# Patient Record
Sex: Female | Born: 1965 | Race: White | Hispanic: No | Marital: Married | Smoking: Never smoker
Health system: Southern US, Community
[De-identification: ages and names within clinical notes are randomized; demographics above are authoritative.]

## PROBLEM LIST (undated history)

## (undated) DIAGNOSIS — L719 Rosacea, unspecified: Secondary | ICD-10-CM

## (undated) DIAGNOSIS — F341 Dysthymic disorder: Secondary | ICD-10-CM

## (undated) DIAGNOSIS — J309 Allergic rhinitis, unspecified: Secondary | ICD-10-CM

## (undated) DIAGNOSIS — E039 Hypothyroidism, unspecified: Secondary | ICD-10-CM

## (undated) HISTORY — DX: Allergic rhinitis, unspecified: J30.9

## (undated) HISTORY — PX: WISDOM TOOTH EXTRACTION: SHX21

## (undated) HISTORY — DX: Rosacea, unspecified: L71.9

## (undated) HISTORY — DX: Dysthymic disorder: F34.1

## (undated) HISTORY — DX: Hypothyroidism, unspecified: E03.9

---

## 1999-01-10 ENCOUNTER — Other Ambulatory Visit: Admission: RE | Admit: 1999-01-10 | Discharge: 1999-01-10 | Payer: Self-pay | Admitting: Obstetrics & Gynecology

## 2000-05-22 ENCOUNTER — Other Ambulatory Visit: Admission: RE | Admit: 2000-05-22 | Discharge: 2000-05-22 | Payer: Self-pay | Admitting: Obstetrics & Gynecology

## 2001-07-04 ENCOUNTER — Other Ambulatory Visit: Admission: RE | Admit: 2001-07-04 | Discharge: 2001-07-04 | Payer: Self-pay | Admitting: Obstetrics & Gynecology

## 2001-12-23 ENCOUNTER — Other Ambulatory Visit: Admission: RE | Admit: 2001-12-23 | Discharge: 2001-12-23 | Payer: Self-pay | Admitting: Obstetrics & Gynecology

## 2002-07-31 ENCOUNTER — Other Ambulatory Visit: Admission: RE | Admit: 2002-07-31 | Discharge: 2002-07-31 | Payer: Self-pay | Admitting: Obstetrics & Gynecology

## 2003-07-15 ENCOUNTER — Other Ambulatory Visit: Admission: RE | Admit: 2003-07-15 | Discharge: 2003-07-15 | Payer: Self-pay | Admitting: Obstetrics & Gynecology

## 2004-07-26 ENCOUNTER — Other Ambulatory Visit: Admission: RE | Admit: 2004-07-26 | Discharge: 2004-07-26 | Payer: Self-pay | Admitting: Obstetrics & Gynecology

## 2009-11-25 ENCOUNTER — Ambulatory Visit: Payer: Self-pay | Admitting: Internal Medicine

## 2009-11-25 DIAGNOSIS — J309 Allergic rhinitis, unspecified: Secondary | ICD-10-CM

## 2009-11-25 DIAGNOSIS — J302 Other seasonal allergic rhinitis: Secondary | ICD-10-CM | POA: Insufficient documentation

## 2009-11-25 DIAGNOSIS — F330 Major depressive disorder, recurrent, mild: Secondary | ICD-10-CM | POA: Insufficient documentation

## 2009-11-25 DIAGNOSIS — E890 Postprocedural hypothyroidism: Secondary | ICD-10-CM

## 2009-11-25 DIAGNOSIS — F329 Major depressive disorder, single episode, unspecified: Secondary | ICD-10-CM

## 2010-03-04 ENCOUNTER — Encounter: Payer: Self-pay | Admitting: Internal Medicine

## 2010-03-14 NOTE — Assessment & Plan Note (Signed)
Summary: to be est/njr   Vital Signs:  Patient profile:   45 year old female Height:      69 inches Weight:      167 pounds BMI:     24.75 Temp:     97.8 degrees F oral BP sitting:   110 / 70  (right arm) Cuff size:   regular  Vitals Entered By: Duard Brady LPN (November 25, 2009 2:51 PM) CC: new to establish - needs PCP Is Patient Diabetic? No   CC:  new to establish - needs PCP.  History of Present Illness: 45 year old patient who is seen to establish with our practice.  She is status post Graves' disease at age 56 treated for a number of years with PTU and following I-131 at age 23.  She has treated hypothyroidism and a long history depression.  She has been on sertraline for least 12 years and is followed annually by Dr. Betti Cruz  Preventive Screening-Counseling & Management  Caffeine-Diet-Exercise     Does Patient Exercise: yes  Allergies (verified): No Known Drug Allergies  Past History:  Past Medical History: Depression (ReddY) rosacea Danella Deis) Hypothyroidism status post I-131 for Graves' disease at age 38 Allergic rhinitis  Past Surgical History: none   Family History: Reviewed history and no changes required. father age 66 in good health with hypercholesterolemia mother, age 66, bipolar disorder one brother, undefined psychiatric condition unable to live independently  Social History: Reviewed history and no changes required. 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    Physical Exam  General:  Well-developed,well-nourished,in no acute distress; alert,appropriate and cooperative throughout examination Head:  Normocephalic and atraumatic without obvious abnormalities. No apparent alopecia or balding. Eyes:  No corneal or conjunctival inflammation noted. EOMI. Perrla. Funduscopic exam benign, without hemorrhages, exudates or papilledema. Vision grossly normal. Ears:  External ear exam shows no significant lesions or deformities.  Otoscopic examination reveals clear canals, tympanic membranes are intact bilaterally without bulging, retraction, inflammation or discharge. Hearing is grossly normal bilaterally. Mouth:  Oral mucosa and oropharynx without lesions or exudates.  Teeth in good repair. Neck:  No deformities, masses, or tenderness noted. Chest Wall:  No deformities, masses, or tenderness noted. Lungs:  Normal respiratory effort, chest expands symmetrically. Lungs are clear to auscultation, no crackles or wheezes. Heart:  Normal rate and regular rhythm. S1 and S2 normal without gallop, murmur, click, rub or other extra sounds. Abdomen:  Bowel sounds positive,abdomen soft and non-tender without masses, organomegaly or hernias noted. Msk:  No deformity or scoliosis noted of thoracic or lumbar spine.   Pulses:  R and L carotid,radial,femoral,dorsalis pedis and posterior tibial pulses are full and equal bilaterally Extremities:  No clubbing, cyanosis, edema, or deformity noted with normal full range of motion of all joints.   Skin:  Intact without suspicious lesions or rashes Cervical Nodes:  No lymphadenopathy noted Axillary Nodes:  No palpable lymphadenopathy Inguinal Nodes:  No significant adenopathy   Impression & Recommendations:  Problem # 1:  Preventive Health Care  (  ICD-V70.0)  Complete Medication List: 1)  Levothroid 175 Mcg Tabs (Levothyroxine sodium) .... Qd 2)  Sertraline Hcl 100 Mg Tabs (Sertraline hcl) .... Qd 3)  Metronidazole 0.75 % Gel (Metronidazole) .... Qd 4)  Multivitamins Tabs (Multiple vitamin) .... Qd  Patient Instructions: 1)  Please schedule a follow-up appointment as needed. 2)  Limit your Sodium (Salt). 3)  It is important that you exercise regularly at least 20 minutes 5 times a week. If you develop chest pain, have severe difficulty breathing, or feel very tired , stop exercising immediately and seek medical attention. 4)  annual gynecologic follow-up Prescriptions: MULTIVITAMINS  TABS (MULTIPLE VITAMIN) qd  #0 x 0   Entered and Authorized by:   Gordy Savers  MD   Signed by:   Gordy Savers  MD on 11/25/2009   Method used:   Print then Give to Patient   RxID:   [id number]    Immunization History:  Influenza Immunization History:    Influenza:  Historical (10/27/2009) per pt done at work. KIK

## 2010-06-15 ENCOUNTER — Encounter: Payer: Self-pay | Admitting: Internal Medicine

## 2010-06-15 ENCOUNTER — Ambulatory Visit (INDEPENDENT_AMBULATORY_CARE_PROVIDER_SITE_OTHER): Payer: BC Managed Care – PPO | Admitting: Internal Medicine

## 2010-06-15 DIAGNOSIS — E039 Hypothyroidism, unspecified: Secondary | ICD-10-CM

## 2010-06-15 NOTE — Patient Instructions (Signed)
Get plenty of rest, Drink lots of  clear liquids, and use Tylenol or ibuprofen for fever and discomfort.    Call or return to clinic prn if these symptoms worsen or fail to improve as anticipated.  

## 2010-06-15 NOTE — Progress Notes (Signed)
  Subjective:    Patient ID: Sara Hull, female    DOB: 03-10-1965, 45 y.o.   MRN: [id number]  HPI  45 year old patient who presents with a one-week history of head and chest congestion she has had the mildly productive cough yielding mainly clear sputum but occasionally yellow tinged. There's been no fever she has had intermittent hoarseness nasal congestion rhinorrhea and a general sense of unwellness. She does have a history of allergic rhinitis;  she took a sick day 3 days ago.    Review of Systems  Constitutional: Positive for fatigue.  HENT: Positive for congestion, rhinorrhea and postnasal drip. Negative for hearing loss, sore throat, dental problem, sinus pressure and tinnitus.   Eyes: Negative for pain, discharge and visual disturbance.  Respiratory: Positive for cough. Negative for shortness of breath.   Cardiovascular: Negative for chest pain, palpitations and leg swelling.  Gastrointestinal: Negative for nausea, vomiting, abdominal pain, diarrhea, constipation, blood in stool and abdominal distention.  Genitourinary: Negative for dysuria, urgency, frequency, hematuria, flank pain, vaginal bleeding, vaginal discharge, difficulty urinating, vaginal pain and pelvic pain.  Musculoskeletal: Negative for joint swelling, arthralgias and gait problem.  Skin: Negative for rash.  Neurological: Negative for dizziness, syncope, speech difficulty, weakness, numbness and headaches.  Hematological: Negative for adenopathy.  Psychiatric/Behavioral: Negative for behavioral problems, dysphoric mood and agitation. The patient is not nervous/anxious.        Objective:   Physical Exam  Constitutional: She is oriented to person, place, and time. She appears well-developed and well-nourished.  HENT:  Head: Normocephalic.  Right Ear: External ear normal.  Left Ear: External ear normal.  Mouth/Throat: Oropharynx is clear and moist.  Eyes: Conjunctivae and EOM are normal. Pupils are equal,  round, and reactive to light.  Neck: Normal range of motion. Neck supple. No thyromegaly present.  Cardiovascular: Normal rate, regular rhythm, normal heart sounds and intact distal pulses.   Pulmonary/Chest: Effort normal and breath sounds normal.  Abdominal: Soft. Bowel sounds are normal. She exhibits no mass. There is no tenderness.  Musculoskeletal: Normal range of motion.  Lymphadenopathy:    She has no cervical adenopathy.  Neurological: She is alert and oriented to person, place, and time.  Skin: Skin is warm and dry. No rash noted.  Psychiatric: She has a normal mood and affect. Her behavior is normal.          Assessment & Plan:   Viral URI. Will treat symptomatically. Was given samples of Vimovo to take one twice daily

## 2010-06-23 ENCOUNTER — Encounter: Payer: Self-pay | Admitting: Internal Medicine

## 2010-06-23 ENCOUNTER — Ambulatory Visit (INDEPENDENT_AMBULATORY_CARE_PROVIDER_SITE_OTHER): Payer: BC Managed Care – PPO | Admitting: Internal Medicine

## 2010-06-23 VITALS — BP 110/70 | Temp 98.1°F | Wt 172.0 lb

## 2010-06-23 DIAGNOSIS — J309 Allergic rhinitis, unspecified: Secondary | ICD-10-CM

## 2010-06-23 MED ORDER — METHYLPREDNISOLONE ACETATE 80 MG/ML IJ SUSP
120.0000 mg | Freq: Once | INTRAMUSCULAR | Status: AC
  Administered 2010-06-23: 120 mg via INTRAMUSCULAR

## 2010-06-23 MED ORDER — PREDNISONE 10 MG PO TABS: 10.0000 mg | ORAL_TABLET | Freq: Two times a day (BID) | ORAL | Status: AC

## 2010-06-23 NOTE — Progress Notes (Signed)
  Subjective:    Patient ID: Sara Hull, female    DOB: 27-Aug-1965, 45 y.o.   MRN: [id number]  HPI  45 year old patient who is seen today with a 2 week history of extreme sinus congestion she has had some minimal sore throat and cough. The cough has improved her chief complaint is sinus congestion left ear discomfort. She does have a history of allergic rhinitis. She has been on Mucinex being and Nasonex with minimal relief. There's been no fever or purulent sputum production. No fever or sinus pain    Review of Systems  Constitutional: Positive for fatigue.  HENT: Positive for congestion. Negative for hearing loss, sore throat, rhinorrhea, dental problem, sinus pressure and tinnitus.   Eyes: Negative for pain, discharge and visual disturbance.  Respiratory: Positive for cough. Negative for shortness of breath.   Cardiovascular: Negative for chest pain, palpitations and leg swelling.  Gastrointestinal: Negative for nausea, vomiting, abdominal pain, diarrhea, constipation, blood in stool and abdominal distention.  Genitourinary: Negative for dysuria, urgency, frequency, hematuria, flank pain, vaginal bleeding, vaginal discharge, difficulty urinating, vaginal pain and pelvic pain.  Musculoskeletal: Negative for joint swelling, arthralgias and gait problem.  Skin: Negative for rash.  Neurological: Negative for dizziness, syncope, speech difficulty, weakness, numbness and headaches.  Hematological: Negative for adenopathy.  Psychiatric/Behavioral: Negative for behavioral problems, dysphoric mood and agitation. The patient is not nervous/anxious.        Objective:   Physical Exam  Constitutional: She is oriented to person, place, and time. She appears well-developed and well-nourished.  HENT:  Head: Normocephalic.  Right Ear: External ear normal.  Left Ear: External ear normal.  Mouth/Throat: Oropharynx is clear and moist.  Eyes: Conjunctivae and EOM are normal. Pupils are equal, round,  and reactive to light.  Neck: Normal range of motion. Neck supple. No thyromegaly present.  Cardiovascular: Normal rate, regular rhythm, normal heart sounds and intact distal pulses.   Pulmonary/Chest: Effort normal and breath sounds normal.  Abdominal: Soft. Bowel sounds are normal. She exhibits no mass. There is no tenderness.  Musculoskeletal: Normal range of motion.  Lymphadenopathy:    She has no cervical adenopathy.  Neurological: She is alert and oriented to person, place, and time.  Skin: Skin is warm and dry. No rash noted.  Psychiatric: She has a normal mood and affect. Her behavior is normal.          Assessment & Plan:   URI with allergic rhinitis flare  We'll continue Mucinex being and Nasonex. Will treat with oral prednisone for 7 days

## 2010-06-23 NOTE — Patient Instructions (Signed)
Continue present regimen Prednisone 10 mg daily for 7 days  Call or return to clinic prn if these symptoms worsen or fail to improve as anticipated.

## 2010-12-15 ENCOUNTER — Ambulatory Visit: Payer: BC Managed Care – PPO | Admitting: Family Medicine

## 2012-03-06 ENCOUNTER — Other Ambulatory Visit (INDEPENDENT_AMBULATORY_CARE_PROVIDER_SITE_OTHER): Payer: BC Managed Care – PPO

## 2012-03-06 DIAGNOSIS — Z0000 Encounter for general adult medical examination without abnormal findings: Secondary | ICD-10-CM

## 2012-03-06 LAB — BASIC METABOLIC PANEL
CO2: 29 mEq/L (ref 19–32)
Calcium: 8.8 mg/dL (ref 8.4–10.5)
Chloride: 104 mEq/L (ref 96–112)
Creatinine, Ser: 0.9 mg/dL (ref 0.4–1.2)
GFR: 74.94 mL/min (ref >60.00)
Glucose, Bld: 89 mg/dL (ref 70–99)
Sodium: 138 mEq/L (ref 135–145)

## 2012-03-06 LAB — CBC WITH DIFFERENTIAL/PLATELET
Basophils Absolute: 0 10*3/uL (ref 0.0–0.1)
Basophils Relative: 0.7 % (ref 0.0–3.0)
Eosinophils Absolute: 0.1 10*3/uL (ref 0.0–0.7)
HCT: 42.8 % (ref 36.0–46.0)
Hemoglobin: 14.6 g/dL (ref 12.0–15.0)
Lymphocytes Relative: 26.8 % (ref 12.0–46.0)
Lymphs Abs: 1.7 10*3/uL (ref 0.7–4.0)
MCHC: 34.1 g/dL (ref 30.0–36.0)
MCV: 91.9 fl (ref 78.0–100.0)
Monocytes Absolute: 0.5 10*3/uL (ref 0.1–1.0)
Monocytes Relative: 7.5 % (ref 3.0–12.0)
Neutro Abs: 4 10*3/uL (ref 1.4–7.7)
Neutrophils Relative %: 63.4 % (ref 43.0–77.0)
Platelets: 183 10*3/uL (ref 150.0–400.0)
RBC: 4.66 Mil/uL (ref 3.87–5.11)
RDW: 13.3 % (ref 11.5–14.6)
WBC: 6.4 10*3/uL (ref 4.5–10.5)

## 2012-03-06 LAB — POCT URINALYSIS DIPSTICK
Glucose, UA: NEGATIVE
Leukocytes, UA: NEGATIVE
Protein, UA: NEGATIVE
Spec Grav, UA: 1.02
Urobilinogen, UA: 0.2

## 2012-03-06 LAB — LIPID PANEL
Cholesterol: 160 mg/dL (ref 0–200)
Triglycerides: 99 mg/dL (ref 0.0–149.0)

## 2012-03-06 LAB — HEPATIC FUNCTION PANEL
Albumin: 3.7 g/dL (ref 3.5–5.2)
Alkaline Phosphatase: 66 U/L (ref 39–117)
Bilirubin, Direct: 0.1 mg/dL (ref 0.0–0.3)
Total Protein: 6.7 g/dL (ref 6.0–8.3)

## 2012-04-01 ENCOUNTER — Encounter: Payer: Self-pay | Admitting: Internal Medicine

## 2012-04-01 ENCOUNTER — Ambulatory Visit (INDEPENDENT_AMBULATORY_CARE_PROVIDER_SITE_OTHER): Payer: BC Managed Care – PPO | Admitting: Internal Medicine

## 2012-04-01 VITALS — BP 140/84 | HR 90 | Temp 97.9°F | Resp 18 | Ht 69.5 in | Wt 187.0 lb

## 2012-04-01 DIAGNOSIS — F329 Major depressive disorder, single episode, unspecified: Secondary | ICD-10-CM

## 2012-04-01 DIAGNOSIS — Z0000 Encounter for general adult medical examination without abnormal findings: Secondary | ICD-10-CM

## 2012-04-01 DIAGNOSIS — F3289 Other specified depressive episodes: Secondary | ICD-10-CM

## 2012-04-01 DIAGNOSIS — E039 Hypothyroidism, unspecified: Secondary | ICD-10-CM

## 2012-04-01 MED ORDER — FLUTICASONE PROPIONATE 50 MCG/ACT NA SUSP: 2.0000 | Freq: Every day | NASAL | Status: DC

## 2012-04-01 MED ORDER — SERTRALINE HCL 100 MG PO TABS: 100.0000 mg | ORAL_TABLET | Freq: Every day | ORAL | Status: DC

## 2012-04-01 MED ORDER — LEVOTHYROXINE SODIUM 175 MCG PO TABS: 175.0000 ug | ORAL_TABLET | Freq: Every day | ORAL | Status: DC

## 2012-04-01 NOTE — Patient Instructions (Signed)
It is important that you exercise regularly, at least 20 minutes 3 to 4 times per week.  If you develop chest pain or shortness of breath seek  medical attention.  You need to lose weight.  Consider a lower calorie diet and regular exercise.  Return in one year for follow-up   

## 2012-04-01 NOTE — Progress Notes (Signed)
Subjective:    Patient ID: Sara Hull, female    DOB: 08/15/1965, 47 y.o.   MRN: [id number]  HPI 47 year old patient who is seen today for an annual exam. She is followed closely by gynecology and is scheduled for followup soon. She has a history of depression and has been on sertraline. She complains of some chronic fatigue. There's been some weight gain and also some concerns about possible OSA.  Past Medical History  Diagnosis Date  . ALLERGIC RHINITIS 11/25/2009  . DEPRESSION 11/25/2009  . HYPOTHYROIDISM 11/25/2009  . Rosacea     History   Social History  . Marital Status: Single    Spouse Name: N/A    Number of Children: N/A  . Years of Education: N/A   Occupational History  . Not on file.   Social History Main Topics  . Smoking status: Never Smoker   . Smokeless tobacco: Never Used  . Alcohol Use: Yes  . Drug Use: No  . Sexually Active: Not on file   Other Topics Concern  . Not on file   Social History Narrative  . No narrative on file    History reviewed. No pertinent past surgical history.  Family History  Problem Relation Age of Onset  . Mental illness Mother     bipolar  . Hyperlipidemia Father   . Mental illness Brother     undefined,unable to live independently    No Known Allergies  Current Outpatient Prescriptions on File Prior to Visit  Medication Sig Dispense Refill  . estradiol (ESTRACE) 0.5 MG tablet Take 0.5 mg by mouth daily. If breakthru bleeding noted       . levothyroxine (SYNTHROID, LEVOTHROID) 175 MCG tablet Take 175 mcg by mouth daily.        . metroNIDAZOLE (METROGEL) 0.75 % gel Apply 1 application topically 2 (two) times daily.        . Multiple Vitamin (MULTIVITAMIN) tablet Take 1 tablet by mouth daily.        . sertraline (ZOLOFT) 100 MG tablet Take 100 mg by mouth daily.         No current facility-administered medications on file prior to visit.    BP 140/84  Pulse 90  Temp(Src) 97.9 F (36.6 C) (Oral)  Resp 18   Ht 5' 9.5" (1.765 m)  Wt 187 lb (84.823 kg)  BMI 27.23 kg/m2  SpO2 96%  LMP 03/26/2012   Family history father age 60 in good health. Mother age 1 has had a history of ischemic bowel and is status post colectomy. One brother has mental health issues  Social history single nonsmoker does enjoy regular exercise intermittently      Review of Systems  Constitutional: Positive for fatigue.  HENT: Negative for hearing loss, congestion, sore throat, rhinorrhea, dental problem, sinus pressure and tinnitus.   Eyes: Negative for pain, discharge and visual disturbance.  Respiratory: Negative for cough and shortness of breath.   Cardiovascular: Negative for chest pain, palpitations and leg swelling.  Gastrointestinal: Negative for nausea, vomiting, abdominal pain, diarrhea, constipation, blood in stool and abdominal distention.  Genitourinary: Negative for dysuria, urgency, frequency, hematuria, flank pain, vaginal bleeding, vaginal discharge, difficulty urinating, vaginal pain and pelvic pain.  Musculoskeletal: Negative for joint swelling, arthralgias and gait problem.  Skin: Negative for rash.  Neurological: Negative for dizziness, syncope, speech difficulty, weakness, numbness and headaches.  Hematological: Negative for adenopathy.  Psychiatric/Behavioral: Negative for behavioral problems, dysphoric mood and agitation. The patient is not nervous/anxious.  Objective:   Physical Exam  Constitutional: She is oriented to person, place, and time. She appears well-developed and well-nourished.  HENT:  Head: Normocephalic.  Right Ear: External ear normal.  Left Ear: External ear normal.  Mouth/Throat: Oropharynx is clear and moist.  Eyes: Conjunctivae and EOM are normal. Pupils are equal, round, and reactive to light.  Neck: Normal range of motion. Neck supple. No thyromegaly present.  Cardiovascular: Normal rate, regular rhythm, normal heart sounds and intact distal pulses.    Pulmonary/Chest: Effort normal and breath sounds normal.  Abdominal: Soft. Bowel sounds are normal. She exhibits no mass. There is no tenderness.  Musculoskeletal: Normal range of motion.  Lymphadenopathy:    She has no cervical adenopathy.  Neurological: She is alert and oriented to person, place, and time.  Skin: Skin is warm and dry. No rash noted.  Psychiatric: She has a normal mood and affect. Her behavior is normal.          Assessment & Plan:  Preventive health exam History depression Fatigue. The patient will consider a home sleep study  Followup GYN Return here one year or as needed

## 2013-04-28 ENCOUNTER — Encounter: Payer: Self-pay | Admitting: Family Medicine

## 2013-04-28 ENCOUNTER — Ambulatory Visit (INDEPENDENT_AMBULATORY_CARE_PROVIDER_SITE_OTHER): Payer: BC Managed Care – PPO | Admitting: Family Medicine

## 2013-04-28 VITALS — BP 124/90 | Temp 97.9°F | Wt 181.0 lb

## 2013-04-28 DIAGNOSIS — L309 Dermatitis, unspecified: Secondary | ICD-10-CM

## 2013-04-28 DIAGNOSIS — L259 Unspecified contact dermatitis, unspecified cause: Secondary | ICD-10-CM

## 2013-04-28 MED ORDER — PREDNISONE 20 MG PO TABS: 20.0000 mg | ORAL_TABLET | Freq: Every day | ORAL | Status: DC

## 2013-04-28 NOTE — Patient Instructions (Signed)
Hives/Allergic reaction Hives are itchy, red, swollen areas of the skin. They can vary in size and location on your body. Hives can come and go for hours or several days (acute hives) or for several weeks (chronic hives). Hives do not spread from person to person (noncontagious). They may get worse with scratching, exercise, and emotional stress. CAUSES   Allergic reaction to food, additives, or drugs.  Infections, including the common cold.  Illness, such as vasculitis, lupus, or thyroid disease.  Exposure to sunlight, heat, or cold.  Exercise.  Stress.  Contact with chemicals. SYMPTOMS   Red or white swollen patches on the skin. The patches may change size, shape, and location quickly and repeatedly.  Itching.  Swelling of the hands, feet, and face. This may occur if hives develop deeper in the skin. DIAGNOSIS  Your caregiver can usually tell what is wrong by performing a physical exam. Skin or blood tests may also be done to determine the cause of your hives. In some cases, the cause cannot be determined. TREATMENT  Mild cases usually get better with medicines such as antihistamines. Severe cases may require an emergency epinephrine injection. If the cause of your hives is known, treatment includes avoiding that trigger.  HOME CARE INSTRUCTIONS   Avoid causes that trigger your hives.  Take antihistamines as directed by your caregiver to reduce the severity of your hives. Non-sedating or low-sedating antihistamines are usually recommended. Do not drive while taking an antihistamine.  Take any other medicines prescribed for itching as directed by your caregiver.  Wear loose-fitting clothing.  Keep all follow-up appointments as directed by your caregiver. SEEK MEDICAL CARE IF:   You have persistent or severe itching that is not relieved with medicine.  You have painful or swollen joints. SEEK IMMEDIATE MEDICAL CARE IF:   You have a fever.  Your tongue or lips are  swollen.  You have trouble breathing or swallowing.  You feel tightness in the throat or chest.  You have abdominal pain. These problems may be the first sign of a life-threatening allergic reaction. Call your local emergency services (911 in [place] MAKE SURE YOU:   Understand these instructions.  Will watch your condition.  Will get help right away if you are not doing well or get worse. Document Released: 01/29/2005 Document Revised: 07/31/2011 Document Reviewed: 04/24/2011 [facility] Patient Information 2014 [place], [place].

## 2013-04-28 NOTE — Progress Notes (Signed)
Chief Complaint  Patient presents with  . Rash    HPI:  Acute visit for rash: -Started yesterday -itchy rash, sudden onset yesterday on trunk, back, arms -she can not think of any new exposure: medication, supplements, detergent, soaps, clothes, detergents -denies: hot tub, pools, swimming, bath, fevers, malaise, upper resp symptoms, swelling of lips face or throat, SOB, wheezing, nausea -only new food is sweet potatoes   ROS: See pertinent positives and negatives per HPI.  Past Medical History  Diagnosis Date  . ALLERGIC RHINITIS 11/25/2009  . DEPRESSION 11/25/2009  . HYPOTHYROIDISM 11/25/2009  . Rosacea     No past surgical history on file.  Family History  Problem Relation Age of Onset  . Mental illness Mother     bipolar  . Hyperlipidemia Father   . Mental illness Brother     undefined,unable to live independently    History   Social History  . Marital Status: Single    Spouse Name: N/A    Number of Children: N/A  . Years of Education: N/A   Social History Main Topics  . Smoking status: Never Smoker   . Smokeless tobacco: Never Used  . Alcohol Use: Yes  . Drug Use: No  . Sexual Activity: None   Other Topics Concern  . None   Social History Narrative  . None    Current outpatient prescriptions:estradiol (ESTRACE) 0.5 MG tablet, Take 0.5 mg by mouth daily. If breakthru bleeding noted , Disp: , Rfl: ;  fluticasone (FLONASE) 50 MCG/ACT nasal spray, Place 2 sprays into the nose daily., Disp: 16 g, Rfl: 6;  levothyroxine (SYNTHROID, LEVOTHROID) 175 MCG tablet, Take 1 tablet (175 mcg total) by mouth daily., Disp: 90 tablet, Rfl: 6;  LOW-OGESTREL 0.3-30 MG-MCG tablet, Take 1 tablet by mouth daily. , Disp: , Rfl:  Multiple Vitamin (MULTIVITAMIN) tablet, Take 1 tablet by mouth daily.  , Disp: , Rfl: ;  sertraline (ZOLOFT) 100 MG tablet, Take 1 tablet (100 mg total) by mouth daily., Disp: 90 tablet, Rfl: 6;  metroNIDAZOLE (METROGEL) 0.75 % gel, Apply 1 application  topically 2 (two) times daily.  , Disp: , Rfl: ;  predniSONE (DELTASONE) 20 MG tablet, Take 1 tablet (20 mg total) by mouth daily with breakfast., Disp: 6 tablet, Rfl: 0  EXAM:  Filed Vitals:   04/28/13 1340  BP: 124/90  Temp: 97.9 F (36.6 C)    Body mass index is 26.35 kg/(m^2).  GENERAL: vitals reviewed and listed above, alert, oriented, appears well hydrated and in no acute distress  HEENT: atraumatic, conjunttiva clear, no obvious abnormalities on inspection of external nose and ears  NECK: no obvious masses on inspection  LUNGS: clear to auscultation bilaterally, no wheezes, rales or rhonchi, good air movement  CV: HRRR, no peripheral edema  MS: moves all extremities without noticeable abnormality  SKIN: fine erythematous papular rash coalescing on trunk, arms, back, neck  PSYCH: pleasant and cooperative, no obvious depression or anxiety  ASSESSMENT AND PLAN:  Discussed the following assessment and plan:  Dermatitis - Plan: predniSONE (DELTASONE) 20 MG tablet  -we discussed possible serious and likely etiologies, workup and treatment, treatment risks and return precautions -after this discussion, Vernona RiegerLaura opted for oral prednisone, antihistamine, allergist -of course, we advised Vernona RiegerLaura  to return or notify a doctor immediately if symptoms worsen or persist or new concerns arise.  -Patient advised to return or notify a doctor immediately if symptoms worsen or persist or new concerns arise.  Patient Instructions  Hives/Allergic reaction  Hives are itchy, red, swollen areas of the skin. They can vary in size and location on your body. Hives can come and go for hours or several days (acute hives) or for several weeks (chronic hives). Hives do not spread from person to person (noncontagious). They may get worse with scratching, exercise, and emotional stress. CAUSES   Allergic reaction to food, additives, or drugs.  Infections, including the common cold.  Illness, such  as vasculitis, lupus, or thyroid disease.  Exposure to sunlight, heat, or cold.  Exercise.  Stress.  Contact with chemicals. SYMPTOMS   Red or white swollen patches on the skin. The patches may change size, shape, and location quickly and repeatedly.  Itching.  Swelling of the hands, feet, and face. This may occur if hives develop deeper in the skin. DIAGNOSIS  Your caregiver can usually tell what is wrong by performing a physical exam. Skin or blood tests may also be done to determine the cause of your hives. In some cases, the cause cannot be determined. TREATMENT  Mild cases usually get better with medicines such as antihistamines. Severe cases may require an emergency epinephrine injection. If the cause of your hives is known, treatment includes avoiding that trigger.  HOME CARE INSTRUCTIONS   Avoid causes that trigger your hives.  Take antihistamines as directed by your caregiver to reduce the severity of your hives. Non-sedating or low-sedating antihistamines are usually recommended. Do not drive while taking an antihistamine.  Take any other medicines prescribed for itching as directed by your caregiver.  Wear loose-fitting clothing.  Keep all follow-up appointments as directed by your caregiver. SEEK MEDICAL CARE IF:   You have persistent or severe itching that is not relieved with medicine.  You have painful or swollen joints. SEEK IMMEDIATE MEDICAL CARE IF:   You have a fever.  Your tongue or lips are swollen.  You have trouble breathing or swallowing.  You feel tightness in the throat or chest.  You have abdominal pain. These problems may be the first sign of a life-threatening allergic reaction. Call your local emergency services (911 in [place] MAKE SURE YOU:   Understand these instructions.  Will watch your condition.  Will get help right away if you are not doing well or get worse. Document Released: 01/29/2005 Document Revised: 07/31/2011 Document  Reviewed: 04/24/2011 [facility] Patient Information 2014 [place], Lona Kettle, Dahlia Client R.

## 2013-04-28 NOTE — Progress Notes (Signed)
Pre visit review using our clinic review tool, if applicable. No additional management support is needed unless otherwise documented below in the visit note. 

## 2013-05-02 ENCOUNTER — Other Ambulatory Visit: Payer: Self-pay | Admitting: Internal Medicine

## 2013-06-16 ENCOUNTER — Other Ambulatory Visit: Payer: Self-pay | Admitting: Internal Medicine

## 2013-07-20 LAB — HM MAMMOGRAPHY: HM Mammogram: NORMAL

## 2013-07-20 LAB — HM PAP SMEAR

## 2013-07-29 ENCOUNTER — Other Ambulatory Visit: Payer: Self-pay | Admitting: Internal Medicine

## 2013-07-31 ENCOUNTER — Ambulatory Visit (INDEPENDENT_AMBULATORY_CARE_PROVIDER_SITE_OTHER): Payer: BC Managed Care – PPO | Admitting: Internal Medicine

## 2013-07-31 ENCOUNTER — Encounter: Payer: Self-pay | Admitting: Internal Medicine

## 2013-07-31 VITALS — BP 118/72 | HR 72 | Temp 98.3°F | Ht 69.0 in | Wt 182.0 lb

## 2013-07-31 DIAGNOSIS — E039 Hypothyroidism, unspecified: Secondary | ICD-10-CM

## 2013-07-31 DIAGNOSIS — F329 Major depressive disorder, single episode, unspecified: Secondary | ICD-10-CM

## 2013-07-31 DIAGNOSIS — F3289 Other specified depressive episodes: Secondary | ICD-10-CM

## 2013-07-31 DIAGNOSIS — J309 Allergic rhinitis, unspecified: Secondary | ICD-10-CM

## 2013-07-31 LAB — TSH: TSH: 0.63 u[IU]/mL (ref 0.35–4.50)

## 2013-07-31 MED ORDER — METRONIDAZOLE 0.75 % EX GEL: 1.0000 "application " | Freq: Two times a day (BID) | CUTANEOUS | Status: DC

## 2013-07-31 MED ORDER — FLUTICASONE PROPIONATE 50 MCG/ACT NA SUSP: 2.0000 | Freq: Every day | NASAL | Status: DC

## 2013-07-31 MED ORDER — SERTRALINE HCL 100 MG PO TABS: ORAL_TABLET | ORAL | Status: DC

## 2013-07-31 MED ORDER — LEVOTHYROXINE SODIUM 175 MCG PO TABS: ORAL_TABLET | ORAL | Status: DC

## 2013-07-31 NOTE — Progress Notes (Signed)
Pre visit review using our clinic review tool, if applicable. No additional management support is needed unless otherwise documented below in the visit note. 

## 2013-07-31 NOTE — Patient Instructions (Signed)
It is important that you exercise regularly, at least 20 minutes 3 to 4 times per week.  If you develop chest pain or shortness of breath seek  medical attention.  Return in one year for follow-up   

## 2013-07-31 NOTE — Progress Notes (Signed)
Subjective:    Patient ID: Sara CruzLaura A Grimmett, female    DOB: 07/18/1965, 48 y.o.   MRN: [id number]  HPI  48 year old patient who has a long history of depression.  She has been followed by psychiatry in the past.  She has been on sertraline for greater than 15 years.  She has failed a dose reduction on a single occasion, but feels she does much better on this medication.  It also helps with the anxiety. She was seen in the spring for a pruritic rash that has resolved and has not recurred.  She has hypothyroidism and history of allergic rhinitis.  In general, she is doing quite well  Past Medical History  Diagnosis Date  . ALLERGIC RHINITIS 11/25/2009  . DEPRESSION 11/25/2009  . HYPOTHYROIDISM 11/25/2009  . Rosacea     History   Social History  . Marital Status: Single    Spouse Name: N/A    Number of Children: N/A  . Years of Education: N/A   Occupational History  . Not on file.   Social History Main Topics  . Smoking status: Never Smoker   . Smokeless tobacco: Never Used  . Alcohol Use: Yes     Comment: occ  . Drug Use: No  . Sexual Activity: Not on file   Other Topics Concern  . Not on file   Social History Narrative  . No narrative on file    History reviewed. No pertinent past surgical history.  Family History  Problem Relation Age of Onset  . Mental illness Mother     bipolar  . Hyperlipidemia Father   . Mental illness Brother     undefined,unable to live independently    No Known Allergies  Current Outpatient Prescriptions on File Prior to Visit  Medication Sig Dispense Refill  . levothyroxine (SYNTHROID, LEVOTHROID) 175 MCG tablet TAKE 1 TABLET BY MOUTH DAILY  90 tablet  0  . metroNIDAZOLE (METROGEL) 0.75 % gel Apply 1 application topically 2 (two) times daily.        . sertraline (ZOLOFT) 100 MG tablet TAKE 1 TABLET BY MOUTH DAILY  90 tablet  0  . estradiol (ESTRACE) 0.5 MG tablet Take 0.5 mg by mouth daily. If breakthru bleeding noted       .  fluticasone (FLONASE) 50 MCG/ACT nasal spray Place 2 sprays into the nose daily.  16 g  6  . LOW-OGESTREL 0.3-30 MG-MCG tablet Take 1 tablet by mouth daily.       . Multiple Vitamin (MULTIVITAMIN) tablet Take 1 tablet by mouth daily.        . predniSONE (DELTASONE) 20 MG tablet Take 1 tablet (20 mg total) by mouth daily with breakfast.  6 tablet  0   No current facility-administered medications on file prior to visit.    BP 118/72  Pulse 72  Temp(Src) 98.3 F (36.8 C)  Ht 5\' 9"  (1.753 m)  Wt 182 lb (82.555 kg)  BMI 26.86 kg/m2       Review of Systems  Constitutional: Negative.   HENT: Negative for congestion, dental problem, hearing loss, rhinorrhea, sinus pressure, sore throat and tinnitus.   Eyes: Negative for pain, discharge and visual disturbance.  Respiratory: Negative for cough and shortness of breath.   Cardiovascular: Negative for chest pain, palpitations and leg swelling.  Gastrointestinal: Negative for nausea, vomiting, abdominal pain, diarrhea, constipation, blood in stool and abdominal distention.  Genitourinary: Negative for dysuria, urgency, frequency, hematuria, flank pain, vaginal bleeding, vaginal discharge,  difficulty urinating, vaginal pain and pelvic pain.  Musculoskeletal: Negative for arthralgias, gait problem and joint swelling.  Skin: Negative for rash.  Neurological: Negative for dizziness, syncope, speech difficulty, weakness, numbness and headaches.  Hematological: Negative for adenopathy.  Psychiatric/Behavioral: Negative for behavioral problems, dysphoric mood and agitation. The patient is nervous/anxious.        Objective:   Physical Exam  Constitutional: She is oriented to person, place, and time. She appears well-developed and well-nourished.  HENT:  Head: Normocephalic.  Right Ear: External ear normal.  Left Ear: External ear normal.  Mouth/Throat: Oropharynx is clear and moist.  Eyes: Conjunctivae and EOM are normal. Pupils are equal,  round, and reactive to light.  Neck: Normal range of motion. Neck supple. No thyromegaly present.  Cardiovascular: Normal rate, regular rhythm, normal heart sounds and intact distal pulses.   Pulmonary/Chest: Effort normal and breath sounds normal.  Abdominal: Soft. Bowel sounds are normal. She exhibits no mass. There is no tenderness.  Musculoskeletal: Normal range of motion.  Lymphadenopathy:    She has no cervical adenopathy.  Neurological: She is alert and oriented to person, place, and time.  Skin: Skin is warm and dry. No rash noted.  Psychiatric: She has a normal mood and affect. Her behavior is normal.          Assessment & Plan:   History depression.  Clinically stable.  Options discussed.  Patient wishes to continue present regimen.  At this time Allergic rhinitis, stable Hypothyroidism.  We'll check a TSH  Recheck one year or as needed

## 2013-12-07 ENCOUNTER — Telehealth: Payer: Self-pay | Admitting: Internal Medicine

## 2013-12-07 NOTE — Telephone Encounter (Signed)
Pt would like Dr. Kirtland BouchardK to increase her sertraline (ZOLOFT) 100 MG tablet to 150 mg??  [facility] - [place], [place] - [address]

## 2013-12-07 NOTE — Telephone Encounter (Signed)
Please see message and advise 

## 2013-12-07 NOTE — Telephone Encounter (Signed)
Ok  ROV 4-6 weeks

## 2013-12-08 MED ORDER — SERTRALINE HCL 100 MG PO TABS: 150.0000 mg | ORAL_TABLET | Freq: Every day | ORAL | Status: DC

## 2013-12-08 NOTE — Telephone Encounter (Signed)
Pt called back, told her Rx for Zoloft was increased to 150 mg daily. Rx sent to pharmacy and need to schedule Physical in 4-6 weeks per Dr. Kirtland BouchardK. Pt verbalized understanding.

## 2013-12-08 NOTE — Telephone Encounter (Signed)
Left message on voicemail to call office. Rx sent to pharmacy, needs to schedule physical in 4-6 weeks per Dr.K.

## 2014-02-10 ENCOUNTER — Telehealth: Payer: Self-pay | Admitting: Internal Medicine

## 2014-02-10 MED ORDER — SERTRALINE HCL 100 MG PO TABS: 150.0000 mg | ORAL_TABLET | Freq: Every day | ORAL | Status: DC

## 2014-02-10 NOTE — Telephone Encounter (Signed)
Pt request refill sertraline (ZOLOFT) 100 MG tablet  (1.5/ day Pt made appt for 1/8 and would like a 30 day only to get get through to appt. [facility]/ [address]

## 2014-02-10 NOTE — Telephone Encounter (Signed)
Rx sent to [facility] 

## 2014-02-10 NOTE — Telephone Encounter (Signed)
Left a message making pt aware rx sent to pharmacy.  

## 2014-02-19 ENCOUNTER — Ambulatory Visit (INDEPENDENT_AMBULATORY_CARE_PROVIDER_SITE_OTHER): Payer: BC Managed Care – PPO | Admitting: Internal Medicine

## 2014-02-19 ENCOUNTER — Encounter: Payer: Self-pay | Admitting: Internal Medicine

## 2014-02-19 VITALS — BP 118/70 | HR 64 | Temp 97.7°F | Resp 20 | Ht 69.0 in | Wt 186.0 lb

## 2014-02-19 DIAGNOSIS — E039 Hypothyroidism, unspecified: Secondary | ICD-10-CM

## 2014-02-19 DIAGNOSIS — F32A Depression, unspecified: Secondary | ICD-10-CM

## 2014-02-19 DIAGNOSIS — J3089 Other allergic rhinitis: Secondary | ICD-10-CM

## 2014-02-19 DIAGNOSIS — F329 Major depressive disorder, single episode, unspecified: Secondary | ICD-10-CM

## 2014-02-19 NOTE — Progress Notes (Signed)
Pre visit review using our clinic review tool, if applicable. No additional management support is needed unless otherwise documented below in the visit note. 

## 2014-02-19 NOTE — Patient Instructions (Addendum)
It is important that you exercise regularly, at least 20 minutes 3 to 4 times per week.  If you develop chest pain or shortness of breath seek  medical attention.  Return in 6 months for follow-up  

## 2014-02-19 NOTE — Progress Notes (Signed)
Subjective:    Patient ID: Sara Hull, female    DOB: 20-Aug-1965, 49 y.o.   MRN: [id number]  HPI 49 year old patient who is seen today in follow-up.  She has a long history of anxiety, depression, and has done quite well on sertraline.  Will recently.  She has uptitrated to 150 mg daily and she has done quite well on this dose.  She has hypothyroidism which has been stable.  She is followed annually by OB/GYN.  No new concerns or complaints.  She feels quite well today. Social history she is a pediatric Doctor, general practice  Past Medical History  Diagnosis Date  . ALLERGIC RHINITIS 11/25/2009  . DEPRESSION 11/25/2009  . HYPOTHYROIDISM 11/25/2009  . Rosacea     History   Social History  . Marital Status: Single    Spouse Name: N/A    Number of Children: N/A  . Years of Education: N/A   Occupational History  . Not on file.   Social History Main Topics  . Smoking status: Never Smoker   . Smokeless tobacco: Never Used  . Alcohol Use: Yes     Comment: occ  . Drug Use: No  . Sexual Activity: Not on file   Other Topics Concern  . Not on file   Social History Narrative    No past surgical history on file.  Family History  Problem Relation Age of Onset  . Mental illness Mother     bipolar  . Hyperlipidemia Father   . Mental illness Brother     undefined,unable to live independently    No Known Allergies  Current Outpatient Prescriptions on File Prior to Visit  Medication Sig Dispense Refill  . estradiol (ESTRACE) 0.5 MG tablet Take 0.5 mg by mouth daily. If breakthru bleeding noted     . fluticasone (FLONASE) 50 MCG/ACT nasal spray Place 2 sprays into both nostrils daily. 16 g 6  . levothyroxine (SYNTHROID, LEVOTHROID) 175 MCG tablet TAKE 1 TABLET BY MOUTH DAILY 90 tablet 4  . metroNIDAZOLE (METROGEL) 0.75 % gel Apply 1 application topically 2 (two) times daily. 45 g 4  . sertraline (ZOLOFT) 100 MG tablet Take 1.5 tablets (150 mg total) by mouth daily. 45  tablet 0   No current facility-administered medications on file prior to visit.    BP 118/70 mmHg  Pulse 64  Temp(Src) 97.7 F (36.5 C) (Oral)  Resp 20  Ht  (1.753 m)  Wt 186 lb (84.369 kg)  BMI 27.45 kg/m2  SpO2 98%  LMP 01/27/2014      Review of Systems  Constitutional: Negative.   HENT: Negative for congestion, dental problem, hearing loss, rhinorrhea, sinus pressure, sore throat and tinnitus.   Eyes: Negative for pain, discharge and visual disturbance.  Respiratory: Negative for cough and shortness of breath.   Cardiovascular: Negative for chest pain, palpitations and leg swelling.  Gastrointestinal: Negative for nausea, vomiting, abdominal pain, diarrhea, constipation, blood in stool and abdominal distention.  Genitourinary: Negative for dysuria, urgency, frequency, hematuria, flank pain, vaginal bleeding, vaginal discharge, difficulty urinating, vaginal pain and pelvic pain.  Musculoskeletal: Negative for joint swelling, arthralgias and gait problem.  Skin: Negative for rash.  Neurological: Negative for dizziness, syncope, speech difficulty, weakness, numbness and headaches.  Hematological: Negative for adenopathy.  Psychiatric/Behavioral: Negative for behavioral problems, dysphoric mood and agitation. The patient is nervous/anxious.        Objective:   Physical Exam  Constitutional: She is oriented to person, place, and time. She  appears well-developed and well-nourished.  HENT:  Head: Normocephalic.  Right Ear: External ear normal.  Left Ear: External ear normal.  Mouth/Throat: Oropharynx is clear and moist.  Eyes: Conjunctivae and EOM are normal. Pupils are equal, round, and reactive to light.  Neck: Normal range of motion. Neck supple. No thyromegaly present.  Cardiovascular: Normal rate, regular rhythm, normal heart sounds and intact distal pulses.   Pulmonary/Chest: Effort normal and breath sounds normal.  Abdominal: Soft. Bowel sounds are normal. She  exhibits no mass. There is no tenderness.  Musculoskeletal: Normal range of motion.  Lymphadenopathy:    She has no cervical adenopathy.  Neurological: She is alert and oriented to person, place, and time.  Skin: Skin is warm and dry. No rash noted.  Psychiatric: She has a normal mood and affect. Her behavior is normal.          Assessment & Plan:   Anxiety, depression, stable Hypothyroidism  Follow-up OB/GYN Recheck here in 6 months with lab

## 2014-03-01 ENCOUNTER — Ambulatory Visit: Payer: BC Managed Care – PPO | Admitting: Internal Medicine

## 2014-03-08 ENCOUNTER — Other Ambulatory Visit: Payer: Self-pay | Admitting: Internal Medicine

## 2014-05-01 ENCOUNTER — Other Ambulatory Visit: Payer: Self-pay | Admitting: Internal Medicine

## 2014-08-31 ENCOUNTER — Other Ambulatory Visit: Payer: Self-pay | Admitting: Internal Medicine

## 2014-09-02 ENCOUNTER — Other Ambulatory Visit: Payer: Self-pay | Admitting: Internal Medicine

## 2014-09-17 ENCOUNTER — Encounter (HOSPITAL_COMMUNITY): Payer: Self-pay | Admitting: *Deleted

## 2014-09-24 ENCOUNTER — Ambulatory Visit (HOSPITAL_COMMUNITY): Payer: BC Managed Care – PPO | Admitting: Anesthesiology

## 2014-09-24 ENCOUNTER — Encounter (HOSPITAL_COMMUNITY)
Admission: RE | Disposition: A | Discharge status: Home / Self Care | Payer: Self-pay | Source: Ambulatory Visit | Attending: Obstetrics

## 2014-09-24 ENCOUNTER — Encounter (HOSPITAL_COMMUNITY): Payer: Self-pay

## 2014-09-24 ENCOUNTER — Ambulatory Visit (HOSPITAL_COMMUNITY)
Admission: RE | Admit: 2014-09-24 | Discharge: 2014-09-24 | Disposition: A | Discharge status: Home / Self Care | Payer: BC Managed Care – PPO | Source: Ambulatory Visit | Attending: Obstetrics | Admitting: Obstetrics

## 2014-09-24 DIAGNOSIS — N840 Polyp of corpus uteri: Secondary | ICD-10-CM | POA: Diagnosis not present

## 2014-09-24 DIAGNOSIS — N939 Abnormal uterine and vaginal bleeding, unspecified: Secondary | ICD-10-CM | POA: Insufficient documentation

## 2014-09-24 DIAGNOSIS — E039 Hypothyroidism, unspecified: Secondary | ICD-10-CM | POA: Diagnosis not present

## 2014-09-24 DIAGNOSIS — F329 Major depressive disorder, single episode, unspecified: Secondary | ICD-10-CM | POA: Diagnosis not present

## 2014-09-24 DIAGNOSIS — N9971 Accidental puncture and laceration of a genitourinary system organ or structure during a genitourinary system procedure: Secondary | ICD-10-CM | POA: Insufficient documentation

## 2014-09-24 DIAGNOSIS — L719 Rosacea, unspecified: Secondary | ICD-10-CM | POA: Diagnosis not present

## 2014-09-24 HISTORY — PX: HYSTEROSCOPY WITH D & C: SHX1775

## 2014-09-24 LAB — CBC
HEMATOCRIT: 44.3 % (ref 36.0–46.0)
HEMOGLOBIN: 15.2 g/dL — AB (ref 12.0–15.0)
MCH: 31.6 pg (ref 26.0–34.0)
MCHC: 34.3 g/dL (ref 30.0–36.0)
MCV: 92.1 fL (ref 78.0–100.0)
Platelets: 178 10*3/uL (ref 150–400)
RBC: 4.81 MIL/uL (ref 3.87–5.11)
RDW: 13 % (ref 11.5–15.5)
WBC: 5.5 10*3/uL (ref 4.0–10.5)

## 2014-09-24 LAB — ABO/RH: ABO/RH(D): O POS

## 2014-09-24 LAB — TYPE AND SCREEN
ABO/RH(D): O POS
Antibody Screen: NEGATIVE

## 2014-09-24 LAB — PREGNANCY, URINE: PREG TEST UR: NEGATIVE

## 2014-09-24 SURGERY — DILATATION AND CURETTAGE /HYSTEROSCOPY
Anesthesia: General | Site: Vagina

## 2014-09-24 MED ORDER — ONDANSETRON HCL 4 MG/2ML IJ SOLN: 4.0000 mg | Freq: Once | INTRAMUSCULAR | Status: DC | PRN

## 2014-09-24 MED ORDER — GLYCINE 1.5 % IR SOLN
Status: DC | PRN
Start: 2014-09-24 — End: 2014-09-24
  Administered 2014-09-24: 4200 mL

## 2014-09-24 MED ORDER — LIDOCAINE HCL (CARDIAC) 20 MG/ML IV SOLN
INTRAVENOUS | Status: AC
  Filled 2014-09-24: qty 5

## 2014-09-24 MED ORDER — FENTANYL CITRATE (PF) 100 MCG/2ML IJ SOLN
INTRAMUSCULAR | Status: AC
  Filled 2014-09-24: qty 4

## 2014-09-24 MED ORDER — LIDOCAINE HCL 1 % IJ SOLN
INTRAMUSCULAR | Status: AC
  Filled 2014-09-24: qty 20

## 2014-09-24 MED ORDER — FENTANYL CITRATE (PF) 100 MCG/2ML IJ SOLN
INTRAMUSCULAR | Status: DC | PRN
  Administered 2014-09-24 (×4): 50 ug via INTRAVENOUS

## 2014-09-24 MED ORDER — LACTATED RINGERS IV SOLN
INTRAVENOUS | Status: DC
  Administered 2014-09-24 (×2): via INTRAVENOUS

## 2014-09-24 MED ORDER — DEXAMETHASONE SODIUM PHOSPHATE 10 MG/ML IJ SOLN
INTRAMUSCULAR | Status: DC | PRN
  Administered 2014-09-24: 4 mg via INTRAVENOUS

## 2014-09-24 MED ORDER — KETOROLAC TROMETHAMINE 30 MG/ML IJ SOLN: 30.0000 mg | Freq: Once | INTRAMUSCULAR | Status: DC | PRN

## 2014-09-24 MED ORDER — SCOPOLAMINE 1 MG/3DAYS TD PT72
MEDICATED_PATCH | TRANSDERMAL | Status: AC
  Administered 2014-09-24: 1.5 mg via TRANSDERMAL
  Filled 2014-09-24: qty 1

## 2014-09-24 MED ORDER — MIDAZOLAM HCL 2 MG/2ML IJ SOLN
INTRAMUSCULAR | Status: AC
Start: 2014-09-24 — End: 2014-09-24
  Filled 2014-09-24: qty 4

## 2014-09-24 MED ORDER — PROPOFOL 10 MG/ML IV BOLUS
INTRAVENOUS | Status: DC | PRN
  Administered 2014-09-24: 150 mg via INTRAVENOUS
  Administered 2014-09-24: 50 mg via INTRAVENOUS

## 2014-09-24 MED ORDER — MEPERIDINE HCL 25 MG/ML IJ SOLN: 6.2500 mg | INTRAMUSCULAR | Status: DC | PRN

## 2014-09-24 MED ORDER — IBUPROFEN 600 MG PO TABS: 600.0000 mg | ORAL_TABLET | Freq: Four times a day (QID) | ORAL | Status: DC | PRN

## 2014-09-24 MED ORDER — FENTANYL CITRATE (PF) 100 MCG/2ML IJ SOLN: 25.0000 ug | INTRAMUSCULAR | Status: DC | PRN

## 2014-09-24 MED ORDER — OXYCODONE-ACETAMINOPHEN 5-325 MG PO TABS: 1.0000 | ORAL_TABLET | Freq: Four times a day (QID) | ORAL | Status: DC | PRN

## 2014-09-24 MED ORDER — PROPOFOL 10 MG/ML IV BOLUS
INTRAVENOUS | Status: AC
  Filled 2014-09-24: qty 20

## 2014-09-24 MED ORDER — LIDOCAINE HCL (CARDIAC) 20 MG/ML IV SOLN
INTRAVENOUS | Status: DC | PRN
  Administered 2014-09-24: 30 mg via INTRAVENOUS

## 2014-09-24 MED ORDER — SCOPOLAMINE 1 MG/3DAYS TD PT72
1.0000 | MEDICATED_PATCH | Freq: Once | TRANSDERMAL | Status: DC
  Administered 2014-09-24: 1.5 mg via TRANSDERMAL

## 2014-09-24 MED ORDER — DEXAMETHASONE SODIUM PHOSPHATE 4 MG/ML IJ SOLN
INTRAMUSCULAR | Status: AC
  Filled 2014-09-24: qty 1

## 2014-09-24 MED ORDER — ONDANSETRON HCL 4 MG/2ML IJ SOLN
INTRAMUSCULAR | Status: DC | PRN
  Administered 2014-09-24: 4 mg via INTRAVENOUS

## 2014-09-24 MED ORDER — LIDOCAINE HCL 1 % IJ SOLN
INTRAMUSCULAR | Status: DC | PRN
  Administered 2014-09-24: 10 mL

## 2014-09-24 MED ORDER — MIDAZOLAM HCL 2 MG/2ML IJ SOLN
INTRAMUSCULAR | Status: DC | PRN
  Administered 2014-09-24: 2 mg via INTRAVENOUS

## 2014-09-24 MED ORDER — ONDANSETRON HCL 4 MG/2ML IJ SOLN
INTRAMUSCULAR | Status: AC
  Filled 2014-09-24: qty 2

## 2014-09-24 SURGICAL SUPPLY — 15 items
CATH ROBINSON RED A/P 16FR (CATHETERS) ×3 IMPLANT
CLOTH BEACON ORANGE TIMEOUT ST (SAFETY) ×3 IMPLANT
CONTAINER PREFILL 10% NBF 60ML (FORM) ×6 IMPLANT
DRSG TELFA 3X8 NADH (GAUZE/BANDAGES/DRESSINGS) ×6 IMPLANT
GLOVE BIO SURGEON STRL SZ 6 (GLOVE) ×3 IMPLANT
GLOVE BIO SURGEON STRL SZ7 (GLOVE) ×3 IMPLANT
GLOVE INDICATOR 6.5 STRL GRN (GLOVE) ×3 IMPLANT
GOWN STRL REUS W/TWL LRG LVL3 (GOWN DISPOSABLE) ×6 IMPLANT
LOOP ANGLED CUTTING 22FR (CUTTING LOOP) ×3 IMPLANT
PACK VAGINAL MINOR WOMEN LF (CUSTOM PROCEDURE TRAY) ×3 IMPLANT
PAD OB MATERNITY 4.3X12.25 (PERSONAL CARE ITEMS) ×3 IMPLANT
TOWEL OR 17X24 6PK STRL BLUE (TOWEL DISPOSABLE) ×6 IMPLANT
TUBING AQUILEX INFLOW (TUBING) ×3 IMPLANT
TUBING AQUILEX OUTFLOW (TUBING) ×3 IMPLANT
WATER STERILE IRR 1000ML POUR (IV SOLUTION) ×3 IMPLANT

## 2014-09-24 NOTE — Transfer of Care (Signed)
Immediate Anesthesia Transfer of Care Note  Patient: Sara Hull  Procedure(s) Performed: Procedure(s): DILATATION AND CURETTAGE /HYSTEROSCOPY (N/A)  Patient Location: PACU  Anesthesia Type:General  Level of Consciousness: awake, alert  and oriented  Airway & Oxygen Therapy: Patient Spontanous Breathing and Patient connected to nasal cannula oxygen  Post-op Assessment: Report given to RN and Post -op Vital signs reviewed and stable  Post vital signs: Reviewed and stable  Last Vitals:  Filed Vitals:   09/24/14 1030  BP: 130/84  Pulse: 76  Temp: 36.6 C  Resp: 18    Complications: No apparent anesthesia complications

## 2014-09-24 NOTE — H&P (Signed)
49 y.o.  Female referred for hysteroscopy, D&C for abnormal uterine bleeding.  Menses increasingly heavy with breakthrough bleeding > 1 year.  She has tried estrogen patch and Necon 1/50 with no success.  She had a gyn scan that showed markedly thickened endometrium (2cm) with tiny cystic components within the endometrium.    Past Medical History  Diagnosis Date  . ALLERGIC RHINITIS 11/25/2009  . DEPRESSION 11/25/2009  . HYPOTHYROIDISM 11/25/2009  . Rosacea     Past Surgical History  Procedure Laterality Date  . Wisdom tooth extraction       Social History   Social History  . Marital Status: Single    Spouse Name: N/A  . Number of Children: N/A  . Years of Education: N/A   Occupational History  . Not on file.   Social History Main Topics  . Smoking status: Never Smoker   . Smokeless tobacco: Never Used  . Alcohol Use: Yes     Comment: occ  . Drug Use: No  . Sexual Activity: Yes    Birth Control/ Protection: Pill   Other Topics Concern  . Not on file   Social History Narrative   Review of patient's allergies indicates no known allergies.     Filed Vitals:   09/24/14 1030  BP: 130/84  Pulse: 76  Temp: 97.9 F (36.6 C)  Resp: 18     General:  NAD Ex:  no edema     A/P   49 y.o. female with abnormal uterine bleeding and thickened endometrium for hysteroscopy, D&C.   Elects for surgical management.  Discussed risks to include infection, bleeding, damage to surrounding structures (including but not limited to vagina, cervix, bladder, uterus, bowel), uterine perforation, need for additional surgical procedures, blood clot, need for blood transfusion. Discussed if polyp or other intrauterine pathology is identified on hysteroscopy, removal would be attempted if possible.  Pt agrees. All questions answered and patient elects to proceed.   UPT neg  Santoria Chason GEFFEL [employer]

## 2014-09-24 NOTE — Anesthesia Preprocedure Evaluation (Addendum)
Anesthesia Evaluation  Patient identified by MRN, date of birth, ID band Patient awake    Reviewed: Allergy & Precautions, H&P , NPO status , Patient's Chart, lab work & pertinent test results  Airway Mallampati: I  TM Distance: >3 FB Neck ROM: full    Dental no notable dental hx.    Pulmonary neg pulmonary ROS,    Pulmonary exam normal       Cardiovascular negative cardio ROS Normal cardiovascular exam    Neuro/Psych negative neurological ROS     GI/Hepatic negative GI ROS, Neg liver ROS,   Endo/Other    Renal/GU negative Renal ROS     Musculoskeletal   Abdominal Normal abdominal exam  (+)   Peds  Hematology negative hematology ROS (+)   Anesthesia Other Findings   Reproductive/Obstetrics                            Anesthesia Physical Anesthesia Plan  ASA: II  Anesthesia Plan: General   Post-op Pain Management:    Induction: Intravenous  Airway Management Planned: LMA  Additional Equipment:   Intra-op Plan:   Post-operative Plan:   Informed Consent: I have reviewed the patients History and Physical, chart, labs and discussed the procedure including the risks, benefits and alternatives for the proposed anesthesia with the patient or authorized representative who has indicated his/her understanding and acceptance.     Plan Discussed with: CRNA and Surgeon  Anesthesia Plan Comments:         Anesthesia Quick Evaluation

## 2014-09-24 NOTE — Op Note (Signed)
Pre-Operative Diagnosis: abnormal uterine bleeding  Postoperative Diagnosis: Endometrial polyp  Procedure: Hyseteroscopy, dilation and curettage, polypectomy   Surgeon: Marlow Baars, MD and Carrington Clamp, MD  Operative Findings: Large endometrial polyp filling the cavity, thickened endometrium.  Uterine perforation mid-procedure at fundus, immediately identified.  Fluid deficit 400 mL Specimen : Endometrial curettings and suspected endometrial polyp EBL: 30cc    After adequate anesthesia was achieved, the patient placed in the dorsal lithotomy position in Gosnell stirrups.  She was prepped and draped in the usual sterile fashion.  The weighted speculum was placed in the vagina and the anterior lip of the cervix grasped with a single-tooth tenaculum. The cervix was serially dilated with Hagar dilators. The hysteroscope was advanced under direct visualization and the cavity distended.  A large endometrial polyp was noted to be filling the cavity.  Due to the size, the attachment point was not clear. Multiple passes with polyp forceps were used to remove fragments of the polyp.  Sharp curettage was unsuccessful at dislodging the polyp from the base.  The operative scope was advanced again under direct visualization.  The alligator forceps were used to attempt removal of the polyp at the suspect attachment to the uterine fundus.  The hysteroscope was removed and polyp forceps again advanced gently to the fundus.  Additional fragments were removed, but a large fragment remained.  At this time, Dr. Henderson Cloud scrubbed for additional assistance.  The cervix was further dilated to 6F.  A larger sharp curettage was performed and the majority of the polyp removed with polyp forceps.  A small segment still remained.  With one additional attempt at curettage, a uterine perforation occurred at the fundus.  This was immediately identified and visualized with the hysteroscope.  A small fragment of the polyp remained.   An additional pass with the polyp forceps was able to remove it in it's entirety.  At the time, the small defect at the fundus was re-examined.  No bleeding was noted.  Outflow was opened and the uterine cavity drained of fluid.  It was re-expanded, and no bleeding noted.  At this time, all instruments were removed from the uterus.  The tenaculum was removed from the cervix.  A bimanual massage was performed and confirmed good uterine tone.  A pitocin infusion was then started to maintain uterine tone.  All instruments were removed from the vagina.  Pressure was used to achieve hemostasis at the tenaculum site. The patient tolerated the procedure well was brought to the recovery room in stable condition for the procedure. All sponge and needle counts correct x2.   [place], [facility]

## 2014-09-24 NOTE — Anesthesia Postprocedure Evaluation (Signed)
Anesthesia Post Note  Patient: Sara Hull  Procedure(s) Performed: Procedure(s) (LRB): DILATATION AND CURETTAGE /HYSTEROSCOPY (N/A)  Anesthesia type: General  Patient location: PACU  Post pain: Pain level controlled  Post assessment: Post-op Vital signs reviewed  Last Vitals:  Filed Vitals:   09/24/14 1430  BP:   Pulse: 77  Temp: 36.6 C  Resp: 14    Post vital signs: Reviewed  Level of consciousness: sedated  Complications: No apparent anesthesia complications

## 2014-09-24 NOTE — Discharge Instructions (Signed)
Pelvic rest x 2 weeks (no intercourse or tampons).  No tub baths or swimming for two weeks.    Do not drive until you are not taking narcotic pain medication.   Call your doctor if you have heavy vaginal bleeding (soaking through a pad an hour or more for >2 hours in a row), temperature >101F, severe nausea, vomiting, severe or worsening abdominal pain, dizziness, shortness of breath, chest pain or any other concerns.  Please take motrin every 6 hours.  Add percocet if your pain is not controlled by motrin alone. You have been prescribed an antibiotic.  Please take one tab twice daily for 3 days.    Dilation and Curettage or Vacuum Curettage, Care After  Refer to this sheet in the next few weeks. These instructions provide you with information on caring for yourself after your procedure. Your health care provider may also give you more specific instructions. Your treatment has been planned according to current medical practices, but problems sometimes occur. Call your health care provider if you have any problems or questions after your procedure.  WHAT TO EXPECT AFTER THE PROCEDURE  After your procedure, it is typical to have light cramping and bleeding. This may last for 2 days to 2 weeks after the procedure.  HOME CARE INSTRUCTIONS  Do not drive for 24 hours.  Wait 1 week before returning to strenuous activities.  Take your temperature 2 times a day for 4 days and write it down. Provide these temperatures to your health care provider if you develop a fever.  Avoid long periods of standing.  Avoid heavy lifting, pushing, or pulling. Do not lift anything heavier than 10 pounds (4.5 kg).  Limit stair climbing to once or twice a day.  Take rest periods often.  You may resume your usual diet.  Drink enough fluids to keep your urine clear or pale yellow.  Your usual bowel function should return. If you have constipation, you may:  Take a mild laxative with permission from your health care  provider.  Add fruit and bran to your diet.  Drink more fluids. Take showers instead of baths until your health care provider gives you permission to take baths.  Do not go swimming or use a hot tub until your health care provider approves.  Try to have someone with you or available to you the first 24-48 hours, especially if you were given a general anesthetic.  Do not douche, use tampons, or have sex (intercourse) for 2 weeks after the procedure.  Only take over-the-counter or prescription medicines as directed by your health care provider. Do not take aspirin. It can cause bleeding.  Follow up with your health care provider as directed. SEEK MEDICAL CARE IF:  You have increasing cramps or pain that is not relieved with medicine.  You have abdominal pain that does not seem to be related to the same area of earlier cramping and pain.  You have bad smelling vaginal discharge.  You have a rash.  You are having problems with any medicine. SEEK IMMEDIATE MEDICAL CARE IF:  You have bleeding that is heavier than a normal menstrual period.  You have a fever.  You have chest pain.  You have shortness of breath.  You feel dizzy or feel like fainting.  You pass out.  You have pain in your shoulder strap area.  You have heavy vaginal bleeding with or without blood clots. MAKE SURE YOU:  Understand these instructions.  Will watch your condition.  Will get help right away if you are not doing well or get worse. Document Released: 01/27/2000 Document Revised: 02/03/2013 Document Reviewed: 08/28/2012  [facility] Patient Information 2015 [place], [place]. This information is not intended to replace advice given to you by your health care provider. Make sure you discuss any questions you have with your health care provider.  General Anesthesia, Care After  Refer to this sheet in the next few weeks. These instructions provide you with information on caring for yourself after your procedure. Your health  care provider may also give you more specific instructions. Your treatment has been planned according to current medical practices, but problems sometimes occur. Call your health care provider if you have any problems or questions after your procedure.  WHAT TO EXPECT AFTER THE PROCEDURE  After the procedure, it is typical to experience:  Sleepiness.  Nausea and vomiting. HOME CARE INSTRUCTIONS  For the first 24 hours after general anesthesia:  Have a responsible person with you.  Do not drive a car. If you are alone, do not take public transportation.  Do not drink alcohol.  Do not take medicine that has not been prescribed by your health care provider.  Do not sign important papers or make important decisions.  You may resume a normal diet and activities as directed by your health care provider. Change bandages (dressings) as directed.  If you have questions or problems that seem related to general anesthesia, call the hospital and ask for the anesthetist or anesthesiologist on call. SEEK MEDICAL CARE IF:  You have nausea and vomiting that continue the day after anesthesia.  You develop a rash. SEEK IMMEDIATE MEDICAL CARE IF:  You have difficulty breathing.  You have chest pain.  You have any allergic problems. Document Released: 05/07/2000 Document Revised: 02/03/2013 Document Reviewed: 08/14/2012  [facility] Patient Information 2015 [place], [place]. This information is not intended to replace advice given to you by your health care provider. Make sure you discuss any questions you have with your health care provider.

## 2014-09-24 NOTE — Anesthesia Procedure Notes (Signed)
Procedure Name: LMA Insertion Date/Time: 09/24/2014 11:57 AM Performed by: Jayne Peckenpaugh G Pre-anesthesia Checklist: Patient identified, Emergency Drugs available, Suction available, Patient being monitored and Timeout performed Patient Re-evaluated:Patient Re-evaluated prior to inductionOxygen Delivery Method: Circle system utilized Preoxygenation: Pre-oxygenation with 100% oxygen Intubation Type: IV induction LMA: LMA inserted Number of attempts: 1 Placement Confirmation: positive ETCO2 Dental Injury: Teeth and Oropharynx as per pre-operative assessment

## 2014-09-24 NOTE — Brief Op Note (Signed)
09/24/2014  1:20 PM  PATIENT:  Sara Hull  49 y.o. female  PRE-OPERATIVE DIAGNOSIS:  DUB  POST-OPERATIVE DIAGNOSIS:  DUB, endometrial polyp  PROCEDURE:  Procedure(s): DILATATION AND CURETTAGE /HYSTEROSCOPY (N/A)  SURGEON:  Surgeon(s) and Role:    * Marlow Baars, MD - Primary  PHYSICIAN ASSISTANT: Carrington Clamp, MD  ANESTHESIA:   general  EBL:  Total I/O In: 1000 [I.V.:1000] Out: 130 [Urine:100; Blood:30]  BLOOD ADMINISTERED:none  DRAINS: none   LOCAL MEDICATIONS USED:  LIDOCAINE   SPECIMEN:  Source of Specimen:  endometrial curettings and polyp  DISPOSITION OF SPECIMEN:  PATHOLOGY  COUNTS:  YES  TOURNIQUET:  * No tourniquets in log *  DICTATION: .Note written in EPIC  PLAN OF CARE: Discharge to home after PACU  PATIENT DISPOSITION:  PACU - hemodynamically stable.   Delay start of Pharmacological VTE agent (>24hrs) due to surgical blood loss or risk of bleeding: not applicable

## 2014-09-27 ENCOUNTER — Encounter (HOSPITAL_COMMUNITY): Payer: Self-pay | Admitting: Obstetrics

## 2014-09-29 ENCOUNTER — Other Ambulatory Visit: Payer: Self-pay | Admitting: Internal Medicine

## 2014-10-26 ENCOUNTER — Other Ambulatory Visit: Payer: Self-pay | Admitting: Internal Medicine

## 2014-11-25 ENCOUNTER — Other Ambulatory Visit: Payer: Self-pay | Admitting: Internal Medicine

## 2014-12-24 ENCOUNTER — Other Ambulatory Visit: Payer: Self-pay | Admitting: *Deleted

## 2014-12-24 MED ORDER — SERTRALINE HCL 100 MG PO TABS: ORAL_TABLET | ORAL | Status: DC

## 2014-12-30 ENCOUNTER — Other Ambulatory Visit (INDEPENDENT_AMBULATORY_CARE_PROVIDER_SITE_OTHER): Payer: BC Managed Care – PPO

## 2014-12-30 DIAGNOSIS — Z0000 Encounter for general adult medical examination without abnormal findings: Secondary | ICD-10-CM | POA: Diagnosis not present

## 2014-12-30 LAB — CBC WITH DIFFERENTIAL/PLATELET
BASOS ABS: 0 10*3/uL (ref 0.0–0.1)
BASOS PCT: 0.7 % (ref 0.0–3.0)
Eosinophils Absolute: 0.1 10*3/uL (ref 0.0–0.7)
Eosinophils Relative: 2.5 % (ref 0.0–5.0)
HEMATOCRIT: 44.6 % (ref 36.0–46.0)
HEMOGLOBIN: 15.1 g/dL — AB (ref 12.0–15.0)
LYMPHS PCT: 31.1 % (ref 12.0–46.0)
Lymphs Abs: 1.6 10*3/uL (ref 0.7–4.0)
MCHC: 33.8 g/dL (ref 30.0–36.0)
MCV: 91.8 fl (ref 78.0–100.0)
MONOS PCT: 9.1 % (ref 3.0–12.0)
Monocytes Absolute: 0.5 10*3/uL (ref 0.1–1.0)
Neutro Abs: 2.8 10*3/uL (ref 1.4–7.7)
Neutrophils Relative %: 56.6 % (ref 43.0–77.0)
Platelets: 204 10*3/uL (ref 150.0–400.0)
RBC: 4.86 Mil/uL (ref 3.87–5.11)
RDW: 13.1 % (ref 11.5–15.5)
WBC: 5 10*3/uL (ref 4.0–10.5)

## 2014-12-30 LAB — POCT URINALYSIS DIPSTICK
Bilirubin, UA: NEGATIVE
GLUCOSE UA: NEGATIVE
Ketones, UA: NEGATIVE
Leukocytes, UA: NEGATIVE
Nitrite, UA: NEGATIVE
PH UA: 6
Protein, UA: NEGATIVE
SPEC GRAV UA: 1.025
Urobilinogen, UA: 0.2

## 2014-12-30 LAB — LIPID PANEL
CHOL/HDL RATIO: 3
Cholesterol: 157 mg/dL (ref 0–200)
HDL: 48.9 mg/dL (ref >39.00)
LDL Cholesterol: 84 mg/dL (ref 0–99)
NonHDL: 108.36
Triglycerides: 120 mg/dL (ref 0.0–149.0)
VLDL: 24 mg/dL (ref 0.0–40.0)

## 2014-12-30 LAB — HEPATIC FUNCTION PANEL
ALT: 18 U/L (ref 0–35)
AST: 22 U/L (ref 0–37)
Albumin: 3.7 g/dL (ref 3.5–5.2)
Alkaline Phosphatase: 77 U/L (ref 39–117)
BILIRUBIN DIRECT: 0.1 mg/dL (ref 0.0–0.3)
Total Bilirubin: 0.5 mg/dL (ref 0.2–1.2)
Total Protein: 7 g/dL (ref 6.0–8.3)

## 2014-12-30 LAB — BASIC METABOLIC PANEL
BUN: 18 mg/dL (ref 6–23)
CHLORIDE: 103 meq/L (ref 96–112)
CO2: 28 mEq/L (ref 19–32)
CREATININE: 0.86 mg/dL (ref 0.40–1.20)
Calcium: 9.1 mg/dL (ref 8.4–10.5)
GFR: 74.07 mL/min (ref >60.00)
Glucose, Bld: 77 mg/dL (ref 70–99)
Potassium: 3.9 mEq/L (ref 3.5–5.1)
SODIUM: 137 meq/L (ref 135–145)

## 2014-12-30 LAB — TSH: TSH: 1.04 u[IU]/mL (ref 0.35–4.50)

## 2015-01-10 ENCOUNTER — Ambulatory Visit (INDEPENDENT_AMBULATORY_CARE_PROVIDER_SITE_OTHER): Payer: BC Managed Care – PPO | Admitting: Internal Medicine

## 2015-01-10 ENCOUNTER — Encounter: Payer: Self-pay | Admitting: Internal Medicine

## 2015-01-10 VITALS — BP 140/90 | HR 68 | Temp 97.9°F | Resp 20 | Ht 68.75 in | Wt 200.0 lb

## 2015-01-10 DIAGNOSIS — J3089 Other allergic rhinitis: Secondary | ICD-10-CM

## 2015-01-10 DIAGNOSIS — L409 Psoriasis, unspecified: Secondary | ICD-10-CM | POA: Insufficient documentation

## 2015-01-10 DIAGNOSIS — Z0000 Encounter for general adult medical examination without abnormal findings: Secondary | ICD-10-CM | POA: Diagnosis not present

## 2015-01-10 DIAGNOSIS — Z23 Encounter for immunization: Secondary | ICD-10-CM

## 2015-01-10 DIAGNOSIS — E039 Hypothyroidism, unspecified: Secondary | ICD-10-CM

## 2015-01-10 NOTE — Addendum Note (Signed)
Addended by: Jimmye NormanPHANOS, Shanitha Twining J on: 01/10/2015 03:08 PM   Modules accepted: Orders

## 2015-01-10 NOTE — Progress Notes (Signed)
Pre visit review using our clinic review tool, if applicable. No additional management support is needed unless otherwise documented below in the visit note. 

## 2015-01-10 NOTE — Progress Notes (Signed)
Subjective:    Patient ID: Sara Hull, female    DOB: 1965/11/16, 49 y.o.   MRN: [id number]  HPI 49 -year-old patient who is seen today for an annual exam.  She is followed closely by gynecology and has had a recent D&C with hysteroscopy. She has a history of depression and has been on sertraline.  She has been evaluated by dermatology for her annual checkup.  Recently and has been diagnosed with the psoriasis of the scalp.  She does complain of some arthralgias involving the small joints of the hand feet and occasional low back pain.  Symptoms are very mild and nonprogressive   Past Medical History  Diagnosis Date  . ALLERGIC RHINITIS 11/25/2009  . DEPRESSION 11/25/2009  . HYPOTHYROIDISM 11/25/2009  . Rosacea     Social History   Social History  . Marital Status: Single    Spouse Name: N/A  . Number of Children: N/A  . Years of Education: N/A   Occupational History  . Not on file.   Social History Main Topics  . Smoking status: Never Smoker   . Smokeless tobacco: Never Used  . Alcohol Use: Yes     Comment: occ  . Drug Use: No  . Sexual Activity: Yes    Birth Control/ Protection: Pill   Other Topics Concern  . Not on file   Social History Narrative    Past Surgical History  Procedure Laterality Date  . Wisdom tooth extraction    . Hysteroscopy w/d&c N/A 09/24/2014    Procedure: DILATATION AND CURETTAGE /HYSTEROSCOPY;  Surgeon: Marlow Baars, MD;  Location: [facility];  Service: Gynecology;  Laterality: N/A;    Family History  Problem Relation Age of Onset  . Mental illness Mother     bipolar  . Hyperlipidemia Father   . Mental illness Brother     undefined,unable to live independently    No Known Allergies  Current Outpatient Prescriptions on File Prior to Visit  Medication Sig Dispense Refill  . fluticasone (FLONASE) 50 MCG/ACT nasal spray PLACE 2 SPRAYS INTO BOTH NOSTRILS DAILY. 16 g 5  . ibuprofen (ADVIL,MOTRIN) 600 MG tablet Take 1 tablet (600 mg  total) by mouth every 6 (six) hours as needed. 40 tablet 0  . levothyroxine (SYNTHROID, LEVOTHROID) 175 MCG tablet TAKE 1 TABLET BY MOUTH DAILY 90 tablet 0  . sertraline (ZOLOFT) 100 MG tablet TAKE 1.5 TABLETS (150 MG TOTAL) BY MOUTH DAILY. 45 tablet 2   No current facility-administered medications on file prior to visit.    BP 140/90 mmHg  Pulse 68  Temp(Src) 97.9 F (36.6 C) (Oral)  Resp 20  Ht 5' 8.75" (1.746 m)  Wt 200 lb (90.719 kg)  BMI 29.76 kg/m2  SpO2 98%  LMP 01/06/2015   Family history father age 50 in good health. Mother age 59 has had a history of ischemic bowel and is status post colectomy. One brother has mental health issues  Social history single nonsmoker does enjoy regular exercise intermittently      Review of Systems  Constitutional: Positive for fatigue.  HENT: Negative for congestion, dental problem, hearing loss, rhinorrhea, sinus pressure, sore throat and tinnitus.   Eyes: Negative for pain, discharge and visual disturbance.  Respiratory: Negative for cough and shortness of breath.   Cardiovascular: Negative for chest pain, palpitations and leg swelling.  Gastrointestinal: Negative for nausea, vomiting, abdominal pain, diarrhea, constipation, blood in stool and abdominal distention.  Genitourinary: Negative for dysuria, urgency, frequency, hematuria, flank pain,  vaginal bleeding, vaginal discharge, difficulty urinating, vaginal pain and pelvic pain.  Musculoskeletal: Negative for joint swelling, arthralgias and gait problem.  Skin: Negative for rash.  Neurological: Negative for dizziness, syncope, speech difficulty, weakness, numbness and headaches.  Hematological: Negative for adenopathy.  Psychiatric/Behavioral: Negative for behavioral problems, dysphoric mood and agitation. The patient is not nervous/anxious.        Objective:   Physical Exam  Constitutional: She is oriented to person, place, and time. She appears well-developed and  well-nourished.  HENT:  Head: Normocephalic.  Right Ear: External ear normal.  Left Ear: External ear normal.  Mouth/Throat: Oropharynx is clear and moist.  Eyes: Conjunctivae and EOM are normal. Pupils are equal, round, and reactive to light.  Neck: Normal range of motion. Neck supple. No thyromegaly present.  Cardiovascular: Normal rate, regular rhythm, normal heart sounds and intact distal pulses.   Pulmonary/Chest: Effort normal and breath sounds normal.  Abdominal: Soft. Bowel sounds are normal. She exhibits no mass. There is no tenderness.  Musculoskeletal: Normal range of motion.  Lymphadenopathy:    She has no cervical adenopathy.  Neurological: She is alert and oriented to person, place, and time.  Skin: Skin is warm and dry. No rash noted.  Psychiatric: She has a normal mood and affect. Her behavior is normal.          Assessment & Plan:  Preventive health exam History depression Psoriasis of the scalp.  Will observe at this time will call for further evaluation if symptoms intensify  Colonoscopy at her convenience within the next year  Followup GYN Return here one year or as needed

## 2015-01-10 NOTE — Patient Instructions (Addendum)
Health Maintenance, Female Adopting a healthy lifestyle and getting preventive care can go a long way to promote health and wellness. Talk with your health care provider about what schedule of regular examinations is right for you. This is a good chance for you to check in with your provider about disease prevention and staying healthy. In between checkups, there are plenty of things you can do on your own. Experts have done a lot of research about which lifestyle changes and preventive measures are most likely to keep you healthy. Ask your health care provider for more information. WEIGHT AND DIET  Eat a healthy diet  Be sure to include plenty of vegetables, fruits, low-fat dairy products, and lean protein.  Do not eat a lot of foods high in solid fats, added sugars, or salt.  Get regular exercise. This is one of the most important things you can do for your health.  Most adults should exercise for at least 150 minutes each week. The exercise should increase your heart rate and make you sweat (moderate-intensity exercise).  Most adults should also do strengthening exercises at least twice a week. This is in addition to the moderate-intensity exercise.  Maintain a healthy weight  Body mass index (BMI) is a measurement that can be used to identify possible weight problems. It estimates body fat based on height and weight. Your health care provider can help determine your BMI and help you achieve or maintain a healthy weight.  For females 20 years of age and older:   A BMI below 18.5 is considered underweight.  A BMI of 18.5 to 24.9 is normal.  A BMI of 25 to 29.9 is considered overweight.  A BMI of 30 and above is considered obese.  Watch levels of cholesterol and blood lipids  You should start having your blood tested for lipids and cholesterol at 50 years of age, then have this test every 5 years.  You may need to have your cholesterol levels checked more often if:  Your lipid  or cholesterol levels are high.  You are older than 50 years of age.  You are at high risk for heart disease.  CANCER SCREENING   Lung Cancer  Lung cancer screening is recommended for adults 55-80 years old who are at high risk for lung cancer because of a history of smoking.  A yearly low-dose CT scan of the lungs is recommended for people who:  Currently smoke.  Have quit within the past 15 years.  Have at least a 30-pack-year history of smoking. A pack year is smoking an average of one pack of cigarettes a day for 1 year.  Yearly screening should continue until it has been 15 years since you quit.  Yearly screening should stop if you develop a health problem that would prevent you from having lung cancer treatment.  Breast Cancer  Practice breast self-awareness. This means understanding how your breasts normally appear and feel.  It also means doing regular breast self-exams. Let your health care provider know about any changes, no matter how small.  If you are in your 20s or 30s, you should have a clinical breast exam (CBE) by a health care provider every 1-3 years as part of a regular health exam.  If you are 40 or older, have a CBE every year. Also consider having a breast X-ray (mammogram) every year.  If you have a family history of breast cancer, talk to your health care provider about genetic screening.  If you   are at high risk for breast cancer, talk to your health care provider about having an MRI and a mammogram every year.  Breast cancer gene (BRCA) assessment is recommended for women who have family members with BRCA-related cancers. BRCA-related cancers include:  Breast.  Ovarian.  Tubal.  Peritoneal cancers.  Results of the assessment will determine the need for genetic counseling and BRCA1 and BRCA2 testing. Cervical Cancer Your health care provider may recommend that you be screened regularly for cancer of the pelvic organs (ovaries, uterus, and  vagina). This screening involves a pelvic examination, including checking for microscopic changes to the surface of your cervix (Pap test). You may be encouraged to have this screening done every 3 years, beginning at age 21.  For women ages 30-65, health care providers may recommend pelvic exams and Pap testing every 3 years, or they may recommend the Pap and pelvic exam, combined with testing for human papilloma virus (HPV), every 5 years. Some types of HPV increase your risk of cervical cancer. Testing for HPV may also be done on women of any age with unclear Pap test results.  Other health care providers may not recommend any screening for nonpregnant women who are considered low risk for pelvic cancer and who do not have symptoms. Ask your health care provider if a screening pelvic exam is right for you.  If you have had past treatment for cervical cancer or a condition that could lead to cancer, you need Pap tests and screening for cancer for at least 20 years after your treatment. If Pap tests have been discontinued, your risk factors (such as having a new sexual partner) need to be reassessed to determine if screening should resume. Some women have medical problems that increase the chance of getting cervical cancer. In these cases, your health care provider may recommend more frequent screening and Pap tests. Colorectal Cancer  This type of cancer can be detected and often prevented.  Routine colorectal cancer screening usually begins at 50 years of age and continues through 50 years of age.  Your health care provider may recommend screening at an earlier age if you have risk factors for colon cancer.  Your health care provider may also recommend using home test kits to check for hidden blood in the stool.  A small camera at the end of a tube can be used to examine your colon directly (sigmoidoscopy or colonoscopy). This is done to check for the earliest forms of colorectal  cancer.  Routine screening usually begins at age 50.  Direct examination of the colon should be repeated every 5-10 years through 50 years of age. However, you may need to be screened more often if early forms of precancerous polyps or small growths are found. Skin Cancer  Check your skin from head to toe regularly.  Tell your health care provider about any new moles or changes in moles, especially if there is a change in a mole's shape or color.  Also tell your health care provider if you have a mole that is larger than the size of a pencil eraser.  Always use sunscreen. Apply sunscreen liberally and repeatedly throughout the day.  Protect yourself by wearing long sleeves, pants, a wide-brimmed hat, and sunglasses whenever you are outside. HEART DISEASE, DIABETES, AND HIGH BLOOD PRESSURE   High blood pressure causes heart disease and increases the risk of stroke. High blood pressure is more likely to develop in:  People who have blood pressure in the high end   of the normal range (130-139/85-89 mm Hg).  People who are overweight or obese.  People who are African American.  If you are 38-23 years of age, have your blood pressure checked every 3-5 years. If you are 61 years of age or older, have your blood pressure checked every year. You should have your blood pressure measured twice--once when you are at a hospital or clinic, and once when you are not at a hospital or clinic. Record the average of the two measurements. To check your blood pressure when you are not at a hospital or clinic, you can use:  An automated blood pressure machine at a pharmacy.  A home blood pressure monitor.  If you are between 45 years and 39 years old, ask your health care provider if you should take aspirin to prevent strokes.  Have regular diabetes screenings. This involves taking a blood sample to check your fasting blood sugar level.  If you are at a normal weight and have a low risk for diabetes,  have this test once every three years after 50 years of age.  If you are overweight and have a high risk for diabetes, consider being tested at a younger age or more often. PREVENTING INFECTION  Hepatitis B  If you have a higher risk for hepatitis B, you should be screened for this virus. You are considered at high risk for hepatitis B if:  You were born in a country where hepatitis B is common. Ask your health care provider which countries are considered high risk.  Your parents were born in a high-risk country, and you have not been immunized against hepatitis B (hepatitis B vaccine).  You have HIV or AIDS.  You use needles to inject street drugs.  You live with someone who has hepatitis B.  You have had sex with someone who has hepatitis B.  You get hemodialysis treatment.  You take certain medicines for conditions, including cancer, organ transplantation, and autoimmune conditions. Hepatitis C  Blood testing is recommended for:  Everyone born from [birth year] through 1966.  Anyone with known risk factors for hepatitis C. Sexually transmitted infections (STIs)  You should be screened for sexually transmitted infections (STIs) including gonorrhea and chlamydia if:  You are sexually active and are younger than 50 years of age.  You are older than 50 years of age and your health care provider tells you that you are at risk for this type of infection.  Your sexual activity has changed since you were last screened and you are at an increased risk for chlamydia or gonorrhea. Ask your health care provider if you are at risk.  If you do not have HIV, but are at risk, it may be recommended that you take a prescription medicine daily to prevent HIV infection. This is called pre-exposure prophylaxis (PrEP). You are considered at risk if:  You are sexually active and do not regularly use condoms or know the HIV status of your partner(s).  You take drugs by injection.  You are sexually  active with a partner who has HIV. Talk with your health care provider about whether you are at high risk of being infected with HIV. If you choose to begin PrEP, you should first be tested for HIV. You should then be tested every 3 months for as long as you are taking PrEP.  PREGNANCY   If you are premenopausal and you may become pregnant, ask your health care provider about preconception counseling.  If you may  become pregnant, take 400 to 800 micrograms (mcg) of folic acid every day.  If you want to prevent pregnancy, talk to your health care provider about birth control (contraception). OSTEOPOROSIS AND MENOPAUSE   Osteoporosis is a disease in which the bones lose minerals and strength with aging. This can result in serious bone fractures. Your risk for osteoporosis can be identified using a bone density scan.  If you are 85 years of age or older, or if you are at risk for osteoporosis and fractures, ask your health care provider if you should be screened.  Ask your health care provider whether you should take a calcium or vitamin D supplement to lower your risk for osteoporosis.  Menopause may have certain physical symptoms and risks.  Hormone replacement therapy may reduce some of these symptoms and risks. Talk to your health care provider about whether hormone replacement therapy is right for you.  HOME CARE INSTRUCTIONS   Schedule regular health, dental, and eye exams.  Stay current with your immunizations.   Do not use any tobacco products including cigarettes, chewing tobacco, or electronic cigarettes.  If you are pregnant, do not drink alcohol.  If you are breastfeeding, limit how much and how often you drink alcohol.  Limit alcohol intake to no more than 1 drink per day for nonpregnant women. One drink equals 12 ounces of beer, 5 ounces of wine, or 1 ounces of hard liquor.  Do not use street drugs.  Do not share needles.  Ask your health care provider for help if  you need support or information about quitting drugs.  Tell your health care provider if you often feel depressed.  Tell your health care provider if you have ever been abused or do not feel safe at home.   This information is not intended to replace advice given to you by your health care provider. Make sure you discuss any questions you have with your health care provider.   Document Released: 08/14/2010 Document Revised: 02/19/2014 Document Reviewed: 12/31/2012 Elsevier Interactive Patient Education [employer].   Schedule your colonoscopy to help detect colon cancer.  Please call to schedule an appointment at your convenience.

## 2015-02-26 ENCOUNTER — Other Ambulatory Visit: Payer: Self-pay | Admitting: Internal Medicine

## 2015-03-11 ENCOUNTER — Other Ambulatory Visit: Payer: Self-pay | Admitting: Internal Medicine

## 2015-04-07 ENCOUNTER — Other Ambulatory Visit: Payer: Self-pay | Admitting: Obstetrics

## 2015-04-07 ENCOUNTER — Encounter: Payer: Self-pay | Admitting: Internal Medicine

## 2015-04-13 HISTORY — PX: HYSTEROSCOPY: SHX211

## 2015-05-06 ENCOUNTER — Ambulatory Visit: Payer: BC Managed Care – PPO | Admitting: Internal Medicine

## 2015-05-10 ENCOUNTER — Ambulatory Visit (INDEPENDENT_AMBULATORY_CARE_PROVIDER_SITE_OTHER): Payer: BC Managed Care – PPO | Admitting: Internal Medicine

## 2015-05-10 ENCOUNTER — Encounter: Payer: Self-pay | Admitting: Internal Medicine

## 2015-05-10 VITALS — BP 128/80 | HR 82 | Temp 97.6°F | Resp 20 | Ht 68.75 in | Wt 202.0 lb

## 2015-05-10 DIAGNOSIS — G4733 Obstructive sleep apnea (adult) (pediatric): Secondary | ICD-10-CM

## 2015-05-10 DIAGNOSIS — Z0000 Encounter for general adult medical examination without abnormal findings: Secondary | ICD-10-CM | POA: Diagnosis not present

## 2015-05-10 DIAGNOSIS — F329 Major depressive disorder, single episode, unspecified: Secondary | ICD-10-CM | POA: Diagnosis not present

## 2015-05-10 DIAGNOSIS — J309 Allergic rhinitis, unspecified: Secondary | ICD-10-CM | POA: Diagnosis not present

## 2015-05-10 DIAGNOSIS — E039 Hypothyroidism, unspecified: Secondary | ICD-10-CM

## 2015-05-10 DIAGNOSIS — F32A Depression, unspecified: Secondary | ICD-10-CM

## 2015-05-10 MED ORDER — BUSPIRONE HCL 15 MG PO TABS: 15.0000 mg | ORAL_TABLET | Freq: Two times a day (BID) | ORAL | Status: DC

## 2015-05-10 MED ORDER — SERTRALINE HCL 100 MG PO TABS: 200.0000 mg | ORAL_TABLET | Freq: Every day | ORAL | Status: DC

## 2015-05-10 NOTE — Patient Instructions (Signed)
Home sleep study as discussed  Call or return to clinic prn if these symptoms worsen or fail to improve as anticipated.  Schedule your colonoscopy to help detect colon cancer.

## 2015-05-10 NOTE — Progress Notes (Signed)
Pre visit review using our clinic review tool, if applicable. No additional management support is needed unless otherwise documented below in the visit note. 

## 2015-05-10 NOTE — Progress Notes (Signed)
Subjective:    Patient ID: Sara Hull, female    DOB: August 13, 1965, 50 y.o.   MRN: [id number]  HPI  49 year old patient has a long history of anxiety, depression.  More recently she has had increase in anxiety following the death of her father, which has resulted in many additional stressors as well as a grief reaction. She wishes to increase sertraline that she has been on chronically at least for a period of time. She has a history of allergic rhinitis.  She needs screening colonoscopy.  There is some suspicion for OSA due to heavy snoring and disruptive sleep patterns noted by her husband  Past Medical History  Diagnosis Date  . ALLERGIC RHINITIS 11/25/2009  . DEPRESSION 11/25/2009  . HYPOTHYROIDISM 11/25/2009  . Rosacea     Social History   Social History  . Marital Status: Single    Spouse Name: N/A  . Number of Children: N/A  . Years of Education: N/A   Occupational History  . Not on file.   Social History Main Topics  . Smoking status: Never Smoker   . Smokeless tobacco: Never Used  . Alcohol Use: Yes     Comment: occ  . Drug Use: No  . Sexual Activity: Yes    Birth Control/ Protection: Pill   Other Topics Concern  . Not on file   Social History Narrative    Past Surgical History  Procedure Laterality Date  . Wisdom tooth extraction    . Hysteroscopy w/d&c N/A 09/24/2014    Procedure: DILATATION AND CURETTAGE /HYSTEROSCOPY;  Surgeon: Marlow Baars, MD;  Location: [facility];  Service: Gynecology;  Laterality: N/A;    Family History  Problem Relation Age of Onset  . Mental illness Mother     bipolar  . Hyperlipidemia Father   . Mental illness Brother     undefined,unable to live independently    No Known Allergies  Current Outpatient Prescriptions on File Prior to Visit  Medication Sig Dispense Refill  . FLUOCINOLONE ACETONIDE BODY 0.01 % OIL Apply 1 application topically at bedtime.     . fluocinonide (LIDEX) 0.05 % external solution Apply 1  application topically as needed.     . fluticasone (FLONASE) 50 MCG/ACT nasal spray PLACE 2 SPRAYS INTO BOTH NOSTRILS DAILY. 16 g 5  . ibuprofen (ADVIL,MOTRIN) 600 MG tablet Take 1 tablet (600 mg total) by mouth every 6 (six) hours as needed. 40 tablet 0  . JUNEL 1/20 1-20 MG-MCG tablet Take 1 tablet by mouth daily.     Marland Kitchen levothyroxine (SYNTHROID, LEVOTHROID) 175 MCG tablet TAKE 1 TABLET BY MOUTH DAILY 90 tablet 1  . metroNIDAZOLE (METROGEL) 0.75 % gel Apply 1 application topically as needed.     . sertraline (ZOLOFT) 100 MG tablet TAKE 1.5 TABLETS (150 MG TOTAL) BY MOUTH DAILY. 45 tablet 1   No current facility-administered medications on file prior to visit.    BP 128/80 mmHg  Pulse 82  Temp(Src) 97.6 F (36.4 C) (Oral)  Resp 20  Ht 5' 8.75" (1.746 m)  Wt 202 lb (91.627 kg)  BMI 30.06 kg/m2  SpO2 98%  LMP 03/30/2015     Review of Systems  Constitutional: Negative.   HENT: Negative for congestion, dental problem, hearing loss, rhinorrhea, sinus pressure, sore throat and tinnitus.   Eyes: Negative for pain, discharge and visual disturbance.  Respiratory: Negative for cough and shortness of breath.   Cardiovascular: Negative for chest pain, palpitations and leg swelling.  Gastrointestinal: Negative  for nausea, vomiting, abdominal pain, diarrhea, constipation, blood in stool and abdominal distention.  Genitourinary: Negative for dysuria, urgency, frequency, hematuria, flank pain, vaginal bleeding, vaginal discharge, difficulty urinating, vaginal pain and pelvic pain.  Musculoskeletal: Negative for joint swelling, arthralgias and gait problem.  Skin: Negative for rash.  Neurological: Negative for dizziness, syncope, speech difficulty, weakness, numbness and headaches.  Hematological: Negative for adenopathy.  Psychiatric/Behavioral: Positive for sleep disturbance. Negative for suicidal ideas, behavioral problems, dysphoric mood and agitation. The patient is nervous/anxious.         Objective:   Physical Exam  Constitutional: She appears well-developed and well-nourished. No distress.  HENT:  Low hanging soft palate  Psychiatric: She has a normal mood and affect. Her behavior is normal. Judgment and thought content normal.          Assessment & Plan:   Anxiety, depression.  We'll increase sertraline to the maximum dose of 200 mg daily.  Was given a printed prescription for BuSpar to add if needed Rule out OSA.  Will set her for home sleep study Preventive health.  We'll set up for initial screening colonoscopy

## 2015-05-24 ENCOUNTER — Other Ambulatory Visit: Payer: Self-pay | Admitting: General Practice

## 2015-05-24 MED ORDER — LEVOTHYROXINE SODIUM 175 MCG PO TABS: 175.0000 ug | ORAL_TABLET | Freq: Every day | ORAL | Status: DC

## 2015-07-20 ENCOUNTER — Encounter: Payer: Self-pay | Admitting: Pulmonary Disease

## 2015-07-20 ENCOUNTER — Ambulatory Visit (INDEPENDENT_AMBULATORY_CARE_PROVIDER_SITE_OTHER): Payer: BC Managed Care – PPO | Admitting: Pulmonary Disease

## 2015-07-20 VITALS — BP 122/76 | HR 81 | Ht 69.0 in | Wt 201.2 lb

## 2015-07-20 DIAGNOSIS — G4733 Obstructive sleep apnea (adult) (pediatric): Secondary | ICD-10-CM

## 2015-07-20 NOTE — Patient Instructions (Signed)
Home sleep study 

## 2015-07-20 NOTE — Assessment & Plan Note (Signed)
Given excessive daytime somnolence, narrow pharyngeal exam, witnessed apneas & loud snoring, obstructive sleep apnea is very likely & an overnight polysomnogram will be scheduled as a home study. The pathophysiology of obstructive sleep apnea , it's cardiovascular consequences & modes of treatment including CPAP were discused with the patient in detail & they evidenced understanding.  Pretest probability is high 

## 2015-07-20 NOTE — Progress Notes (Signed)
Subjective:    Patient ID: Sara Hull, female    DOB: 04/02/1965, 50 y.o.   MRN: [id number]  HPI  Chief Complaint  Patient presents with  . Sleep Consult    Referred by Dr. Kirtland BouchardK; has not had sleep study before.  snoring, last 2-3 weeks has not been snoring. Witnessed apneas during surgical procedures.     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  She reports loud snoring for many years. She reports several nocturnal awakenings including gasping episodes that have been witnessed by her husband she tosses and turns a lot and has frequent restroom visits during night. Epworth sleepiness score is 6 but she also reports excessive daytime fatigue She drinks 3-4 x12 ounce caffeinated beverages every morning Bedtime is around 11 PM, sleep latency is minimal, she sleeps on her side with 2 pillows reports 2-3 nocturnal awakenings and is out of bed by 6:45 AM feeling tired with occasional dryness of mouth and denies headaches.  There is no history suggestive of cataplexy, sleep paralysis or parasomnias   She has gained 35 pounds over the last 2 years which she attributes to her parents illness. She's been on Zoloft for many years,      Past Medical History  Diagnosis Date  . ALLERGIC RHINITIS 11/25/2009  . DEPRESSION 11/25/2009  . HYPOTHYROIDISM 11/25/2009  . Rosacea     Past Surgical History  Procedure Laterality Date  . Wisdom tooth extraction    . Hysteroscopy w/d&c N/A 09/24/2014    Procedure: DILATATION AND CURETTAGE /HYSTEROSCOPY;  Surgeon: Marlow Baarsyanna Clark, MD;  Location: [facility];  Service: Gynecology;  Laterality: N/A;     No Known Allergies  Social History   Social History  . Marital Status: Single    Spouse Name: N/A  .  Number of Children: N/A  . Years of Education: N/A   Occupational History  . Not on file.   Social History Main Topics  . Smoking status: Never Smoker   . Smokeless tobacco: Never Used  . Alcohol Use: Yes     Comment: occ  . Drug Use: No  . Sexual Activity: Yes    Birth Control/ Protection: Pill   Other Topics Concern  . Not on file   Social History Narrative     Family History  Problem Relation Age of Onset  . Mental illness Mother     bipolar  . Hyperlipidemia Father   . Mental illness Brother     undefined,unable to live independently     Review of Systems neg for any significant sore throat, dysphagia, itching, sneezing, nasal congestion or excess/ purulent secretions, fever, chills, sweats, unintended wt loss, pleuritic or exertional cp, hempoptysis, orthopnea pnd or change in chronic leg swelling.  Also denies presyncope, palpitations, heartburn, abdominal pain, nausea, vomiting, diarrhea or change in bowel or urinary habits, dysuria,hematuria, rash, arthralgias, visual complaints, headache, numbness weakness or ataxia.     Objective:   Physical Exam  Gen. Pleasant, well-nourished, in no distress ENT - no lesions, no post nasal drip Neck: No JVD, no thyromegaly, no carotid bruits Lungs: no use of accessory muscles, no dullness to percussion, clear without rales or rhonchi  Cardiovascular: Rhythm regular, heart sounds  normal, no murmurs or gallops, no peripheral edema Musculoskeletal: No deformities, no cyanosis or clubbing  Assessment & Plan:

## 2015-07-20 NOTE — Progress Notes (Signed)
   Subjective:    Patient ID: Sara Hull, female    DOB: Jun 10, 1965, 50 y.o.   MRN: [id number]  HPI    Review of Systems  Constitutional: Negative for fever, chills and unexpected weight change.  HENT: Negative for congestion, dental problem, ear pain, nosebleeds, postnasal drip, rhinorrhea, sinus pressure, sneezing, sore throat, trouble swallowing and voice change.   Eyes: Negative for visual disturbance.  Respiratory: Negative for cough, choking and shortness of breath.   Cardiovascular: Negative for chest pain and leg swelling.  Gastrointestinal: Negative for vomiting, abdominal pain and diarrhea.  Genitourinary: Negative for difficulty urinating.  Musculoskeletal: Negative for arthralgias.  Skin: Negative for rash.  Neurological: Negative for tremors, syncope and headaches.  Hematological: Does not bruise/bleed easily.       Objective:   Physical Exam        Assessment & Plan:

## 2015-07-25 ENCOUNTER — Telehealth: Payer: Self-pay | Admitting: Pulmonary Disease

## 2015-07-25 DIAGNOSIS — G4733 Obstructive sleep apnea (adult) (pediatric): Secondary | ICD-10-CM

## 2015-07-25 NOTE — Telephone Encounter (Signed)
-  denied HST Split ordered

## 2015-07-26 ENCOUNTER — Other Ambulatory Visit: Payer: Self-pay | Admitting: *Deleted

## 2015-07-26 DIAGNOSIS — G4733 Obstructive sleep apnea (adult) (pediatric): Secondary | ICD-10-CM

## 2015-09-05 ENCOUNTER — Telehealth: Payer: Self-pay | Admitting: Pulmonary Disease

## 2015-09-05 NOTE — Telephone Encounter (Signed)
LVM for pt to return call

## 2015-09-05 NOTE — Telephone Encounter (Signed)
Spoke with Sara Hull about upcoming sleep study.  Sara Hull is anxious about test and afraid that she will have trouble sleeping due to being in different surroundings.  She wants to know if Dr Vassie Loll will consider giving her something to help her sleep just in case but she will try not to take it.  Please advise

## 2015-09-05 NOTE — Telephone Encounter (Signed)
Ambien 5 mg 1

## 2015-09-05 NOTE — Telephone Encounter (Signed)
Pt returning call

## 2015-09-06 ENCOUNTER — Other Ambulatory Visit: Payer: Self-pay | Admitting: Internal Medicine

## 2015-09-06 MED ORDER — ZOLPIDEM TARTRATE 5 MG PO TABS: 5.0000 mg | ORAL_TABLET | Freq: Every evening | ORAL | 0 refills | Status: DC | PRN

## 2015-09-06 NOTE — Telephone Encounter (Signed)
Called and spoke with pt and she is aware of RA recs and that the zolpidem 5 mg  #1  Tablet has been called to her pharmacy.  Nothing further is needed.

## 2015-09-11 ENCOUNTER — Ambulatory Visit (HOSPITAL_BASED_OUTPATIENT_CLINIC_OR_DEPARTMENT_OTHER): Payer: BC Managed Care – PPO | Attending: Pulmonary Disease | Admitting: Pulmonary Disease

## 2015-09-11 VITALS — Ht 69.0 in | Wt 201.0 lb

## 2015-09-11 DIAGNOSIS — R0683 Snoring: Secondary | ICD-10-CM | POA: Diagnosis not present

## 2015-09-11 DIAGNOSIS — G4733 Obstructive sleep apnea (adult) (pediatric): Secondary | ICD-10-CM | POA: Insufficient documentation

## 2015-09-11 DIAGNOSIS — Z79899 Other long term (current) drug therapy: Secondary | ICD-10-CM | POA: Diagnosis not present

## 2015-09-11 DIAGNOSIS — R5383 Other fatigue: Secondary | ICD-10-CM | POA: Diagnosis not present

## 2015-09-11 DIAGNOSIS — G4763 Sleep related bruxism: Secondary | ICD-10-CM | POA: Diagnosis not present

## 2015-09-12 ENCOUNTER — Telehealth: Payer: Self-pay | Admitting: Pulmonary Disease

## 2015-09-12 ENCOUNTER — Encounter (HOSPITAL_BASED_OUTPATIENT_CLINIC_OR_DEPARTMENT_OTHER): Payer: Self-pay | Admitting: Pulmonary Disease

## 2015-09-12 DIAGNOSIS — G4733 Obstructive sleep apnea (adult) (pediatric): Secondary | ICD-10-CM | POA: Diagnosis not present

## 2015-09-12 NOTE — Telephone Encounter (Signed)
Attempted to contact patient, left message for patient to return call.

## 2015-09-12 NOTE — Procedures (Signed)
Patient Name: Sara Hull, Sara Hull Date: 09/11/2015 Gender: Female D.O.B: Sep 05, 1965 Age (years): 17 Referring Provider: Cyril Mourning MD, ABSM Height (inches): 69 Interpreting Physician: Cyril Mourning MD, ABSM Weight (lbs): 201 RPSGT: Cherylann Parr BMI: 30 MRN: [id number] Neck Size: 14.00   CLINICAL INFORMATION Sleep Study Type: NPSG Indication for sleep study: Fatigue, Snoring Epworth Sleepiness Score: 5   SLEEP STUDY TECHNIQUE As per the AASM Manual for the Scoring of Sleep and Associated Events v2.3 (April 2016) with a hypopnea requiring 4% desaturations. The channels recorded and monitored were frontal, central and occipital EEG, electrooculogram (EOG), submentalis EMG (chin), nasal and oral airflow, thoracic and abdominal wall motion, anterior tibialis EMG, snore microphone, electrocardiogram, and pulse oximetry.   MEDICATIONS Patient's medications include: AMBIEN, LEVOTHYROXINE, SERTRALINE. Medications self-administered by patient during sleep study : No sleep medicine administered.   SLEEP ARCHITECTURE The study was initiated at 11:00:15 PM and ended at 5:19:57 AM. Sleep onset time was 7.6 minutes and the sleep efficiency was 74.9%. The total sleep time was 284.5 minutes. Stage REM latency was 252.5 minutes. The patient spent 7.73% of the night in stage N1 sleep, 62.92% in stage N2 sleep, 23.20% in stage N3 and 6.15% in REM. Alpha intrusion was absent. Supine sleep was 4.57%.   RESPIRATORY PARAMETERS The overall apnea/hypopnea index (AHI) was 1.1 per hour. There were 3 total apneas, including 0 obstructive, 3 central and 0 mixed apneas. There were 2 hypopneas and 0 RERAs. The AHI during Stage REM sleep was 3.4 per hour. AHI while supine was 0.0 per hour. The mean oxygen saturation was 95.32%. The minimum SpO2 during sleep was 91.00%. Loud snoring was noted during this study.   CARDIAC DATA The 2 lead EKG demonstrated sinus rhythm, pacemaker generated. The mean heart  rate was 70.46 beats per minute. Other EKG findings include: None.   LEG MOVEMENT DATA The total PLMS were 0 with a resulting PLMS index of 0.00. Associated arousal with leg movement index was 0.0 .   IMPRESSIONS - No significant obstructive sleep apnea occurred during this study (AHI = 1.1/h). - No significant central sleep apnea occurred during this study (CAI = 0.6/h). - The patient had minimal or no oxygen desaturation during the study (Min O2 = 91.00%) - The patient snored with Loud snoring volume. - No cardiac abnormalities were noted during this study. - Clinically significant periodic limb movements did not occur during sleep. No significant associated arousals.   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 - Sleep hygiene should be reviewed to assess factors that may improve sleep quality. - Weight management and regular exercise should be initiated or continued if appropriate.   Cyril Mourning MD. Tonny Bollman. [facility]   09/12/2015

## 2015-09-12 NOTE — Procedures (Signed)
    NAME: Sara Hull DATE OF BIRTH:  [birth year] MEDICAL RECORD NUMBER [id number]  LOCATION: [facility]  PHYSICIAN: Weldon Picking  DATE OF STUDY: 09/11/2015  SLEEP STUDY TYPE: Out of Center Sleep Test                REFERRING PHYSICIAN: Oretha Milch, MD  INDICATION FOR STUDY:  EPWORTH SLEEPINESS SCORE:   HEIGHT: 5\' 9"  (175.3 cm)  WEIGHT: 201 lb (91.2 kg)    Body mass index is 29.68 kg/m.  NECK SIZE: 14 in.  MEDICATIONS:   IMPRESSION:      RECOMMENDATION:    Weldon Picking Diplomate, American Board of Sleep Medicine  ELECTRONICALLY SIGNED ON:  09/12/2015, 4:13 AM [name] PH: [phone]   FX: [phone] ACCREDITED BY THE AMERICAN ACADEMY OF SLEEP MEDICINE

## 2015-09-12 NOTE — Telephone Encounter (Signed)
No evidence of OSA on study No sig O2 desatns Mild teeth grinding- can consider mouth guard if this is a problem

## 2015-09-13 NOTE — Telephone Encounter (Signed)
Pt returned call Discussed sleep study results with her.  Pt voiced her understanding and reported that does typically wear a mouth guard, but did not for the purposes of the test.  She is now asking "where do I go from here?" Dr Vassie Loll please advise, thank you.  **Also, pt requested a copy of her sleep study results.  These have been printed and placed in the mail to her verified home address.

## 2015-09-13 NOTE — Telephone Encounter (Signed)
Attempted to contact patient, left message for patient to return call.

## 2015-09-13 NOTE — Telephone Encounter (Signed)
Discussed sleep study results in detail and options including positional therapy and weight loss

## 2015-09-14 ENCOUNTER — Encounter: Payer: Self-pay | Admitting: Gastroenterology

## 2015-09-19 ENCOUNTER — Other Ambulatory Visit: Payer: Self-pay | Admitting: Obstetrics & Gynecology

## 2015-09-20 LAB — CYTOLOGY - PAP

## 2015-09-22 ENCOUNTER — Encounter: Payer: Self-pay | Admitting: *Deleted

## 2015-09-30 ENCOUNTER — Ambulatory Visit (AMBULATORY_SURGERY_CENTER): Payer: Self-pay | Admitting: *Deleted

## 2015-09-30 ENCOUNTER — Encounter: Payer: Self-pay | Admitting: Gastroenterology

## 2015-09-30 VITALS — Ht 69.0 in | Wt 200.0 lb

## 2015-09-30 DIAGNOSIS — Z1211 Encounter for screening for malignant neoplasm of colon: Secondary | ICD-10-CM

## 2015-09-30 MED ORDER — NA SULFATE-K SULFATE-MG SULF 17.5-3.13-1.6 GM/177ML PO SOLN: 1.0000 | Freq: Once | ORAL | 0 refills | Status: AC

## 2015-09-30 NOTE — Progress Notes (Signed)
No egg or soy allergy known to patient  No issues with past sedation with any surgeries  or procedures, no intubation problems  No diet pills per patient No home 02 use per patient  No blood thinners per patient  Pt denies issues with constipation  No A Fib or flutter   emmi to patients e mail

## 2015-10-10 ENCOUNTER — Ambulatory Visit (AMBULATORY_SURGERY_CENTER): Payer: BC Managed Care – PPO | Admitting: Gastroenterology

## 2015-10-10 ENCOUNTER — Other Ambulatory Visit: Payer: Self-pay | Admitting: Internal Medicine

## 2015-10-10 ENCOUNTER — Encounter: Payer: Self-pay | Admitting: Gastroenterology

## 2015-10-10 VITALS — BP 105/69 | HR 79 | Temp 99.3°F | Resp 10 | Ht 69.0 in | Wt 200.0 lb

## 2015-10-10 DIAGNOSIS — Z1211 Encounter for screening for malignant neoplasm of colon: Secondary | ICD-10-CM

## 2015-10-10 MED ORDER — SODIUM CHLORIDE 0.9 % IV SOLN: 500.0000 mL | INTRAVENOUS | Status: DC

## 2015-10-10 NOTE — Progress Notes (Signed)
A/ox3, pleased with MAC, report to RN 

## 2015-10-10 NOTE — Patient Instructions (Signed)
Impression/recommendations:  Normal colonoscopy Repeat colonoscopy in 10 years.  YOU HAD AN ENDOSCOPIC PROCEDURE TODAY AT THE [facility]:   Refer to the procedure report that was given to you for any specific questions about what was found during the examination.  If the procedure report does not answer your questions, please call your gastroenterologist to clarify.  If you requested that your care partner not be given the details of your procedure findings, then the procedure report has been included in a sealed envelope for you to review at your convenience later.  YOU SHOULD EXPECT: Some feelings of bloating in the abdomen. Passage of more gas than usual.  Walking can help get rid of the air that was put into your GI tract during the procedure and reduce the bloating. If you had a lower endoscopy (such as a colonoscopy or flexible sigmoidoscopy) you may notice spotting of blood in your stool or on the toilet paper. If you underwent a bowel prep for your procedure, you may not have a normal bowel movement for a few days.  Please Note:  You might notice some irritation and congestion in your nose or some drainage.  This is from the oxygen used during your procedure.  There is no need for concern and it should clear up in a day or so.  SYMPTOMS TO REPORT IMMEDIATELY:   Following lower endoscopy (colonoscopy or flexible sigmoidoscopy):  Excessive amounts of blood in the stool  Significant tenderness or worsening of abdominal pains  Swelling of the abdomen that is new, acute  Fever of 100F or higher  For urgent or emergent issues, a gastroenterologist can be reached at any hour by calling [phone].   DIET:  We do recommend a small meal at first, but then you may proceed to your regular diet.  Drink plenty of fluids but you should avoid alcoholic beverages for 24 hours.  ACTIVITY:  You should plan to take it easy for the rest of today and you should NOT DRIVE or use heavy  machinery until tomorrow (because of the sedation medicines used during the test).    FOLLOW UP: Our staff will call the number listed on your records the next business day following your procedure to check on you and address any questions or concerns that you may have regarding the information given to you following your procedure. If we do not reach you, we will leave a message.  However, if you are feeling well and you are not experiencing any problems, there is no need to return our call.  We will assume that you have returned to your regular daily activities without incident.  If any biopsies were taken you will be contacted by phone or by letter within the next 1-3 weeks.  Please call us at [phone] if you have not heard about the biopsies in 3 weeks.    SIGNATURES/CONFIDENTIALITY: You and/or your care partner have signed paperwork which will be entered into your electronic medical record.  These signatures attest to the fact that that the information above on your After Visit Summary has been reviewed and is understood.  Full responsibility of the confidentiality of this discharge information lies with you and/or your care-partner. 

## 2015-10-10 NOTE — Op Note (Signed)
[facility] Patient Name: Sara Hull Procedure Date: 10/10/2015 2:26 PM MRN: [id number] Endoscopist: Sherilyn Cooter L. Myrtie Neither , MD Age: 50 Referring MD:  Date of Birth: Jul 30, 1965 Gender: Female Account #: [id number] Procedure:                Colonoscopy Indications:              Screening for colorectal malignant neoplasm, This                            is the patient's first colonoscopy Medicines:                Monitored Anesthesia Care Procedure:                Pre-Anesthesia Assessment:                           - Prior to the procedure, a History and Physical                            was performed, and patient medications and                            allergies were reviewed. The patient's tolerance of                            previous anesthesia was also reviewed. The risks                            and benefits of the procedure and the sedation                            options and risks were discussed with the patient.                            All questions were answered, and informed consent                            was obtained. Prior Anticoagulants: The patient has                            taken no previous anticoagulant or antiplatelet                            agents. ASA Grade Assessment: II - A patient with                            mild systemic disease. After reviewing the risks                            and benefits, the patient was deemed in                            satisfactory condition to undergo the procedure.  After obtaining informed consent, the colonoscope                            was passed under direct vision. Throughout the                            procedure, the patient's blood pressure, pulse, and                            oxygen saturations were monitored continuously. The                            Model CF-HQ190L [id number](SN#[id number]) scope was introduced                            through the anus and  advanced to the the cecum,                            identified by appendiceal orifice and ileocecal                            valve. The colonoscopy was somewhat difficult due                            to significant looping. Successful completion of                            the procedure was aided by using manual pressure.                            The patient tolerated the procedure well. The                            quality of the bowel preparation was evaluated                            using the BBPS [facility]([place] Bowel Preparation Scale)                            with scores of: Right Colon = 3, Transverse Colon =                            3 and Left Colon = 3 (entire mucosa seen well with                            no residual staining, small fragments of stool or                            opaque liquid). The total BBPS score equals 9. The                            bowel preparation used was SUPREP. The ileocecal  valve, appendiceal orifice, and rectum were                            photographed. Scope In: 2:35:02 PM Scope Out: 2:51:03 PM Scope Withdrawal Time: 0 hours 8 minutes 9 seconds  Total Procedure Duration: 0 hours 16 minutes 1 second  Findings:                 The perianal and digital rectal examinations were                            normal.                           The entire examined colon appeared normal on direct                            and retroflexion views. Complications:            No immediate complications. Estimated Blood Loss:     Estimated blood loss: none. Impression:               - The entire examined colon is normal on direct and                            retroflexion views.                           - No specimens collected. Recommendation:           - Patient has a contact number available for                            emergencies. The signs and symptoms of potential                            delayed  complications were discussed with the                            patient. Return to normal activities tomorrow.                            Written discharge instructions were provided to the                            patient.                           - Resume previous diet.                           - Continue present medications.                           - Repeat colonoscopy in 10 years for screening                            purposes. Henry L. Myrtie Neither, MD 10/10/2015 2:55:42 PM This  report has been signed electronically.

## 2015-10-10 NOTE — Telephone Encounter (Signed)
Rx refill sent to pharmacy. 

## 2015-10-11 ENCOUNTER — Telehealth: Payer: Self-pay

## 2015-10-11 NOTE — Telephone Encounter (Signed)
  Follow up Call-  Call back number 10/10/2015  Post procedure Call Back phone  # [phone]  Permission to leave phone message Yes  Some recent data might be hidden     Patient questions:  Do you have a fever, pain , or abdominal swelling? No. Pain Score  0 *  Have you tolerated food without any problems? Yes.    Have you been able to return to your normal activities? Yes.    Do you have any questions about your discharge instructions: Diet   No. Medications  No. Follow up visit  No.  Do you have questions or concerns about your Care? No.  Actions: * If pain score is 4 or above: No action needed, pain <4.

## 2015-12-05 ENCOUNTER — Other Ambulatory Visit: Payer: Self-pay | Admitting: Internal Medicine

## 2015-12-31 ENCOUNTER — Other Ambulatory Visit: Payer: Self-pay | Admitting: Internal Medicine

## 2016-01-26 ENCOUNTER — Other Ambulatory Visit: Payer: Self-pay | Admitting: Internal Medicine

## 2016-03-25 ENCOUNTER — Other Ambulatory Visit: Payer: Self-pay | Admitting: Internal Medicine

## 2016-06-05 ENCOUNTER — Other Ambulatory Visit: Payer: Self-pay | Admitting: Internal Medicine

## 2016-06-08 ENCOUNTER — Telehealth: Payer: Self-pay | Admitting: Internal Medicine

## 2016-06-08 MED ORDER — SERTRALINE HCL 100 MG PO TABS: 200.0000 mg | ORAL_TABLET | Freq: Every day | ORAL | 5 refills | Status: DC

## 2016-06-08 NOTE — Telephone Encounter (Signed)
Medication refilled

## 2016-06-08 NOTE — Telephone Encounter (Signed)
Pt request refill  sertraline (ZOLOFT) 100 MG tablet  [id number] - [place], [place] - [address]  Pt has appt 5/21 but will run out prior to then. Can you send in 30 day to pharmacy.

## 2016-07-02 ENCOUNTER — Ambulatory Visit (INDEPENDENT_AMBULATORY_CARE_PROVIDER_SITE_OTHER): Payer: BC Managed Care – PPO | Admitting: Internal Medicine

## 2016-07-02 ENCOUNTER — Encounter: Payer: Self-pay | Admitting: Internal Medicine

## 2016-07-02 VITALS — BP 112/68 | HR 80 | Temp 98.1°F | Ht 69.0 in | Wt 197.0 lb

## 2016-07-02 DIAGNOSIS — J3089 Other allergic rhinitis: Secondary | ICD-10-CM

## 2016-07-02 DIAGNOSIS — E039 Hypothyroidism, unspecified: Secondary | ICD-10-CM | POA: Diagnosis not present

## 2016-07-02 DIAGNOSIS — F3340 Major depressive disorder, recurrent, in remission, unspecified: Secondary | ICD-10-CM | POA: Diagnosis not present

## 2016-07-02 DIAGNOSIS — Z0000 Encounter for general adult medical examination without abnormal findings: Secondary | ICD-10-CM

## 2016-07-02 LAB — CBC WITH DIFFERENTIAL/PLATELET
BASOS ABS: 0 10*3/uL (ref 0.0–0.1)
Basophils Relative: 0.8 % (ref 0.0–3.0)
EOS ABS: 0.1 10*3/uL (ref 0.0–0.7)
Eosinophils Relative: 3 % (ref 0.0–5.0)
HEMATOCRIT: 43.9 % (ref 36.0–46.0)
HEMOGLOBIN: 14.9 g/dL (ref 12.0–15.0)
LYMPHS PCT: 34.7 % (ref 12.0–46.0)
Lymphs Abs: 1.5 10*3/uL (ref 0.7–4.0)
MCHC: 33.9 g/dL (ref 30.0–36.0)
MCV: 92.9 fl (ref 78.0–100.0)
Monocytes Absolute: 0.4 10*3/uL (ref 0.1–1.0)
Monocytes Relative: 10.1 % (ref 3.0–12.0)
Neutro Abs: 2.3 10*3/uL (ref 1.4–7.7)
Neutrophils Relative %: 51.4 % (ref 43.0–77.0)
Platelets: 180 10*3/uL (ref 150.0–400.0)
RBC: 4.73 Mil/uL (ref 3.87–5.11)
RDW: 12.9 % (ref 11.5–15.5)
WBC: 4.4 10*3/uL (ref 4.0–10.5)

## 2016-07-02 LAB — COMPREHENSIVE METABOLIC PANEL
ALBUMIN: 3.9 g/dL (ref 3.5–5.2)
ALK PHOS: 67 U/L (ref 39–117)
ALT: 20 U/L (ref 0–35)
AST: 22 U/L (ref 0–37)
BILIRUBIN TOTAL: 0.3 mg/dL (ref 0.2–1.2)
BUN: 14 mg/dL (ref 6–23)
CALCIUM: 9.2 mg/dL (ref 8.4–10.5)
CO2: 27 mEq/L (ref 19–32)
CREATININE: 0.93 mg/dL (ref 0.40–1.20)
Chloride: 105 mEq/L (ref 96–112)
GFR: 67.27 mL/min (ref >60.00)
Glucose, Bld: 96 mg/dL (ref 70–99)
Potassium: 4 mEq/L (ref 3.5–5.1)
Sodium: 138 mEq/L (ref 135–145)
Total Protein: 6.9 g/dL (ref 6.0–8.3)

## 2016-07-02 LAB — TSH: TSH: 8.12 u[IU]/mL — ABNORMAL HIGH (ref 0.35–4.50)

## 2016-07-02 LAB — LIPID PANEL
CHOLESTEROL: 160 mg/dL (ref 0–200)
HDL: 37.9 mg/dL — ABNORMAL LOW (ref >39.00)
LDL CALC: 90 mg/dL (ref 0–99)
NonHDL: 121.6
Total CHOL/HDL Ratio: 4
Triglycerides: 158 mg/dL — ABNORMAL HIGH (ref 0.0–149.0)
VLDL: 31.6 mg/dL (ref 0.0–40.0)

## 2016-07-02 MED ORDER — SERTRALINE HCL 100 MG PO TABS: 200.0000 mg | ORAL_TABLET | Freq: Every day | ORAL | 5 refills | Status: DC

## 2016-07-02 NOTE — Patient Instructions (Addendum)
WE NOW OFFER   [facility] FAST TRACK!!!  SAME DAY Appointments for ACUTE CARE  Such as: Sprains, Injuries, cuts, abrasions, rashes, muscle pain, joint pain, back pain Colds, flu, sore throats, headache, allergies, cough, fever  Ear pain, sinus and eye infections Abdominal pain, nausea, vomiting, diarrhea, upset stomach Animal/insect bites  3 Easy Ways to Schedule: Walk-In Scheduling Call in scheduling Mychart Sign-up: [email or web address]     It is important that you exercise regularly, at least 20 minutes 3 to 4 times per week.  If you develop chest pain or shortness of breath seek  medical attention.  Return in one year for follow-up      

## 2016-07-02 NOTE — Progress Notes (Signed)
Subjective:    Patient ID: Sara Hull, female    DOB: 1965-08-09, 51 y.o.   MRN: [id number]  HPI  [age] year old patient who is seen today for follow-up.  She has a history of allergic rhinitis and hypothyroidism.  She also has history depression, which has been stable on sertraline.  She remains on 150 mg daily.  Past Medical History:  Diagnosis Date  . ALLERGIC RHINITIS 11/25/2009  . Allergy   . DEPRESSION 11/25/2009  . HYPOTHYROIDISM 11/25/2009  . Rosacea   . Sleep apnea    pt had sleep study and has NO sleep apnea 09-2015     Social History   Social History  . Marital status: Married    Spouse name: N/A  . Number of children: N/A  . Years of education: N/A   Occupational History  . Not on file.   Social History Main Topics  . Smoking status: Never Smoker  . Smokeless tobacco: Never Used  . Alcohol use Yes     Comment: occ  . Drug use: No  . Sexual activity: Yes    Birth control/ protection: Pill   Other Topics Concern  . Not on file   Social History Narrative  . No narrative on file    Past Surgical History:  Procedure Laterality Date  . HYSTEROSCOPY  04/2015   with ablation  . HYSTEROSCOPY W/D&C N/A 09/24/2014   Procedure: DILATATION AND CURETTAGE /HYSTEROSCOPY;  Surgeon: Marlow Baarsyanna Clark, MD;  Location: [facility];  Service: Gynecology;  Laterality: N/A;  . WISDOM TOOTH EXTRACTION      Family History  Problem Relation Age of Onset  . Mental illness Mother        bipolar  . Other Mother        nectrotizing of the colon-had ileostomy that has been reversed  . Hyperlipidemia Father   . Colon polyps Father   . Cancer Father        bile duct cancer  . Mental illness Brother        undefined,unable to live independently  . Colon cancer Neg Hx   . Esophageal cancer Neg Hx   . Rectal cancer Neg Hx   . Stomach cancer Neg Hx     No Known Allergies  Current Outpatient Prescriptions on File Prior to Visit  Medication Sig Dispense Refill  . cetirizine  (ZYRTEC) 10 MG tablet Take 10 mg by mouth daily.    Marland Kitchen. FLUOCINOLONE ACETONIDE BODY 0.01 % OIL Apply 1 application topically at bedtime.     . fluticasone (FLONASE) 50 MCG/ACT nasal spray PLACE 2 SPRAYS INTO BOTH NOSTRILS DAILY. 16 g 5  . ibuprofen (ADVIL,MOTRIN) 600 MG tablet Take 1 tablet (600 mg total) by mouth every 6 (six) hours as needed. 40 tablet 0  . JUNEL 1/20 1-20 MG-MCG tablet Take 1 tablet by mouth daily.     Marland Kitchen. levothyroxine (SYNTHROID, LEVOTHROID) 175 MCG tablet TAKE ONE TABLET BY MOUTH DAILY 90 tablet 0  . metroNIDAZOLE (METROGEL) 0.75 % gel Apply 1 application topically as needed.      Current Facility-Administered Medications on File Prior to Visit  Medication Dose Route Frequency Provider Last Rate Last Dose  . 0.9 %  sodium chloride infusion  500 mL Intravenous Continuous Charlie Pitteranis, Henry L III, MD        BP 112/68 (BP Location: Left Arm, Patient Position: Sitting, Cuff Size: Normal)   Pulse 80   Temp 98.1 F (36.7 C) (Oral)   Ht 5'  9" (1.753 m)   Wt 197 lb (89.4 kg)   SpO2 98%   BMI 29.09 kg/m     Review of Systems  Constitutional: Negative.   HENT: Negative for congestion, dental problem, hearing loss, rhinorrhea, sinus pressure, sore throat and tinnitus.   Eyes: Negative for pain, discharge and visual disturbance.  Respiratory: Negative for cough and shortness of breath.   Cardiovascular: Negative for chest pain, palpitations and leg swelling.  Gastrointestinal: Negative for abdominal distention, abdominal pain, blood in stool, constipation, diarrhea, nausea and vomiting.  Genitourinary: Negative for difficulty urinating, dysuria, flank pain, frequency, hematuria, pelvic pain, urgency, vaginal bleeding, vaginal discharge and vaginal pain.  Musculoskeletal: Negative for arthralgias, gait problem and joint swelling.  Skin: Negative for rash.  Neurological: Negative for dizziness, syncope, speech difficulty, weakness, numbness and headaches.  Hematological: Negative  for adenopathy.  Psychiatric/Behavioral: Positive for dysphoric mood. Negative for agitation and behavioral problems. The patient is not nervous/anxious.        Objective:   Physical Exam  Constitutional: She is oriented to person, place, and time. She appears well-developed and well-nourished.  HENT:  Head: Normocephalic.  Right Ear: External ear normal.  Left Ear: External ear normal.  Mouth/Throat: Oropharynx is clear and moist.  Eyes: Conjunctivae and EOM are normal. Pupils are equal, round, and reactive to light.  Neck: Normal range of motion. Neck supple. No thyromegaly present.  Cardiovascular: Normal rate, regular rhythm, normal heart sounds and intact distal pulses.   Pulmonary/Chest: Effort normal and breath sounds normal.  Abdominal: Soft. Bowel sounds are normal. She exhibits no mass. There is no tenderness.  Musculoskeletal: Normal range of motion.  Lymphadenopathy:    She has no cervical adenopathy.  Neurological: She is alert and oriented to person, place, and time.  Skin: Skin is warm and dry. No rash noted.  Psychiatric: She has a normal mood and affect. Her behavior is normal.          Assessment & Plan:   History depression.  Remains in remission.  No change in therapy  Hypothyroidism.  We'll check a TSH   allergic rhinitis.  Well-controlled on present regimen  Follow-up one year or as needed  Rogelia Boga

## 2016-07-04 ENCOUNTER — Other Ambulatory Visit: Payer: Self-pay | Admitting: *Deleted

## 2016-07-04 DIAGNOSIS — R8290 Unspecified abnormal findings in urine: Secondary | ICD-10-CM

## 2016-07-25 ENCOUNTER — Ambulatory Visit (INDEPENDENT_AMBULATORY_CARE_PROVIDER_SITE_OTHER): Payer: BC Managed Care – PPO | Admitting: Family Medicine

## 2016-07-25 ENCOUNTER — Encounter: Payer: Self-pay | Admitting: Family Medicine

## 2016-07-25 VITALS — BP 123/90 | HR 75 | Temp 98.0°F | Ht 69.0 in | Wt 197.0 lb

## 2016-07-25 DIAGNOSIS — S39012A Strain of muscle, fascia and tendon of lower back, initial encounter: Secondary | ICD-10-CM | POA: Diagnosis not present

## 2016-07-25 MED ORDER — CYCLOBENZAPRINE HCL 10 MG PO TABS: 10.0000 mg | ORAL_TABLET | Freq: Three times a day (TID) | ORAL | 0 refills | Status: DC | PRN

## 2016-07-25 NOTE — Progress Notes (Signed)
   Subjective:    Patient ID: Sara Hull, female    DOB: [birth year], 51 y.o.   MRN: [id number]  HPI Here for 3 days of pain and tightness in the right lower back. She has a tendency for her back to tighten up when she exercises or goes walking, so she tries to stretch out thoroughly beforehand. However last Sunday while unloading her dishwasher she had a sudden spasm and pain in the right lower back. She has been using heat, stretches, and Naproxen and it has improved since then. No pain in the legs.    Review of Systems  Constitutional: Negative.   Respiratory: Negative.   Cardiovascular: Negative.   Musculoskeletal: Positive for back pain.       Objective:   Physical Exam  Constitutional: She appears well-developed and well-nourished.  Cardiovascular: Normal rate, regular rhythm, normal heart sounds and intact distal pulses.   Pulmonary/Chest: Effort normal and breath sounds normal.  Musculoskeletal:  The right lower back is tight and tender, ROM is full however, SLR are negative           Assessment & Plan:  Lumbar strain. Add Flexeril tid prn.  Gershon CraneStephen Fry, MD

## 2016-07-25 NOTE — Patient Instructions (Signed)
WE NOW OFFER   [facility] FAST TRACK!!!  SAME DAY Appointments for ACUTE CARE  Such as: Sprains, Injuries, cuts, abrasions, rashes, muscle pain, joint pain, back pain Colds, flu, sore throats, headache, allergies, cough, fever  Ear pain, sinus and eye infections Abdominal pain, nausea, vomiting, diarrhea, upset stomach Animal/insect bites  3 Easy Ways to Schedule: Walk-In Scheduling Call in scheduling Mychart Sign-up: [email or web address]         

## 2016-08-02 ENCOUNTER — Other Ambulatory Visit (INDEPENDENT_AMBULATORY_CARE_PROVIDER_SITE_OTHER): Payer: BC Managed Care – PPO

## 2016-08-02 DIAGNOSIS — E039 Hypothyroidism, unspecified: Secondary | ICD-10-CM | POA: Diagnosis not present

## 2016-08-02 DIAGNOSIS — R7989 Other specified abnormal findings of blood chemistry: Secondary | ICD-10-CM

## 2016-08-02 LAB — TSH: TSH: 8.02 u[IU]/mL — AB (ref 0.35–4.50)

## 2016-08-03 MED ORDER — LEVOTHYROXINE SODIUM 200 MCG PO TABS: 200.0000 ug | ORAL_TABLET | Freq: Every day | ORAL | 1 refills | Status: DC

## 2016-09-02 ENCOUNTER — Other Ambulatory Visit: Payer: Self-pay | Admitting: Internal Medicine

## 2016-10-16 ENCOUNTER — Ambulatory Visit: Payer: BC Managed Care – PPO

## 2016-10-16 DIAGNOSIS — R7989 Other specified abnormal findings of blood chemistry: Secondary | ICD-10-CM

## 2016-10-16 DIAGNOSIS — E039 Hypothyroidism, unspecified: Secondary | ICD-10-CM

## 2016-10-16 LAB — TSH: TSH: 0.94 u[IU]/mL (ref 0.35–4.50)

## 2016-11-01 ENCOUNTER — Encounter: Payer: Self-pay | Admitting: Internal Medicine

## 2017-01-25 ENCOUNTER — Other Ambulatory Visit: Payer: Self-pay | Admitting: Internal Medicine

## 2017-03-12 ENCOUNTER — Other Ambulatory Visit: Payer: Self-pay | Admitting: Internal Medicine

## 2017-04-24 ENCOUNTER — Other Ambulatory Visit: Payer: Self-pay | Admitting: Internal Medicine

## 2017-06-23 ENCOUNTER — Other Ambulatory Visit: Payer: Self-pay | Admitting: Internal Medicine

## 2017-07-14 ENCOUNTER — Other Ambulatory Visit: Payer: Self-pay | Admitting: Internal Medicine

## 2017-07-15 NOTE — Telephone Encounter (Signed)
Pt transferring to Dr.Wallce.

## 2017-07-31 ENCOUNTER — Telehealth: Payer: Self-pay | Admitting: Internal Medicine

## 2017-07-31 MED ORDER — LEVOTHYROXINE SODIUM 200 MCG PO TABS: ORAL_TABLET | ORAL | 0 refills | Status: DC

## 2017-07-31 NOTE — Telephone Encounter (Signed)
Medication filled to pharmacy as requested for 30 days until appointment w/ Dr. Earlene PlaterWallace.

## 2017-07-31 NOTE — Telephone Encounter (Signed)
Copied from CRM [id number]. Topic: Quick Communication - Rx Refill/Question >> Jul 31, 2017  3:01 PM Maia PettiesOrtiz, Kristie S wrote: Medication: levothyroxine (SYNTHROID, LEVOTHROID) 200 MCG tablet  - pt is scheduled to Dr. Earlene PlaterWallace 08/12/17. The refill was requested thru the pharmacy but denied. Pt has 4 days on hand and is requesting a 30 day supply until she is seen by Dr. Earlene PlaterWallace.  Has the patient contacted their pharmacy? Yes - denied Preferred Pharmacy (with phone number or street name): [facility] [address] [place], [place] [address] [phone] (Phone) [phone] (Fax)

## 2017-08-11 NOTE — Progress Notes (Signed)
Erling CruzLaura A Threats is a 52 y.o. female [facility]   Patient Care Team: Helane RimaWallace, Bijan Ridgley, DO as PCP - General (Family Medicine) Annamaria HellingWein, Robert, MD as Consulting Physician (Obstetrics and Gynecology)   History of Present Illness:   HPI:   1. Hypothyroidism following radioiodine therapy. With weight gain. Compliant with medication. Endocrine ROS: negative for - hair pattern changes, malaise/lethargy, mood swings, palpitations, polydipsia/polyuria, skin changes or temperature intolerance.   2. Lumbar pain. Daily for 6 months. No injury. Worse with standing or sitting for > 20 minutes. Has tried OTC pain medications with little relief. No prior issues. No radiation of pain. No paresthesias.    There are no preventive care reminders to display for this patient. Depression screen PHQ 2/9 07/02/2016  Decreased Interest 0  Down, Depressed, Hopeless 0  PHQ - 2 Score 0   PMHx, SurgHx, SocialHx, Medications, and Allergies were reviewed in the Visit Navigator and updated as appropriate.   Past Medical History:  Diagnosis Date  . Allergic rhinitis   . Dysthymia   . Hypothyroidism   . Rosacea      Past Surgical History:  Procedure Laterality Date  . HYSTEROSCOPY  04/2015   WITH ABLATION  . HYSTEROSCOPY W/D&C N/A 09/24/2014   Procedure: DILATATION AND CURETTAGE /HYSTEROSCOPY;  Surgeon: Marlow Baarsyanna Clark, MD;  Location: [facility];  Service: Gynecology;  Laterality: N/A;  . WISDOM TOOTH EXTRACTION       Family History  Problem Relation Age of Onset  . Other Mother        nectrotizing of the colon-had ileostomy that has been reversed  . Bipolar disorder Mother   . Hyperlipidemia Father   . Colon polyps Father   . Cancer Father        bile duct cancer  . Mental illness Brother        undefined, unable to live independently  . Colon cancer Neg Hx   . Esophageal cancer Neg Hx   . Rectal cancer Neg Hx   . Stomach cancer Neg Hx     Social History   Tobacco Use  . Smoking  status: Never Smoker  . Smokeless tobacco: Never Used  Substance Use Topics  . Alcohol use: Yes    Comment: occ  . Drug use: No    Current Medications and Allergies:   .  Fluocinolone Acetonide Body 0.01 % OIL, Apply 1 application topically at bedtime., Disp: 1 Bottle, Rfl: 0 .  fluticasone (FLONASE) 50 MCG/ACT nasal spray, 2 SPRAYS BOTH NOSTRILS DAILY, Disp: 16 g, Rfl: 4 .  JUNEL 1/20 1-20 MG-MCG tablet, Take 1 tablet by mouth daily. , Disp: , Rfl:  .  levothyroxine (SYNTHROID, LEVOTHROID) 200 MCG tablet, TAKE ONE TABLET BY MOUTH DAILY BEFORE BREAKFAST, Disp .  sertraline (ZOLOFT) 100 MG tablet, Take 1 tablet (100 mg total) by mouth daily., Disp: 90 tablet, Rfl: 2 .  cyclobenzaprine (FLEXERIL) 10 MG tablet, Take 1 tablet (10 mg total) by mouth 3 (three) times daily as needed for muscle spasms.  No Known Allergies   Review of Systems:   Pertinent items are noted in the HPI. Otherwise, ROS is negative.  Vitals:   Vitals:   08/12/17 1405  BP: 120/88  Pulse: 90  Temp: 98.6 F (37 C)  TempSrc: Oral  SpO2: 96%  Weight: 203 lb (92.1 kg)  Height: 5\' 9"  (1.753 m)     Body mass index is 29.98 kg/m.  Physical Exam:   Physical Exam  Constitutional: She is oriented to person, place, and time. She appears well-developed and well-nourished. No distress.  HENT:  Head: Normocephalic and atraumatic.  Right Ear: External ear normal.  Left Ear: External ear normal.  Nose: Nose normal.  Mouth/Throat: Oropharynx is clear and moist.  Eyes: Pupils are equal, round, and reactive to light. Conjunctivae and EOM are normal.  Neck: Normal range of motion. Neck supple. No thyromegaly present.  Cardiovascular: Normal rate, regular rhythm, normal heart sounds and intact distal pulses.  Pulmonary/Chest: Effort normal and breath sounds normal.  Abdominal: Soft. Bowel sounds are normal.  Musculoskeletal: Normal range of motion.  Lymphadenopathy:    She has no cervical adenopathy.    Neurological: She is alert and oriented to person, place, and time.  Skin: Skin is warm and dry. Capillary refill takes less than 2 seconds.  Psychiatric: She has a normal mood and affect. Her behavior is normal.  Nursing note and vitals reviewed.  Results for orders placed or performed in visit on 08/12/17  TSH  Result Value Ref Range   TSH 0.22 (L) 0.35 - 4.50 uIU/mL  CBC with Differential/Platelet  Result Value Ref Range   WBC 3.6 (L) 4.0 - 10.5 K/uL   RBC 4.60 3.87 - 5.11 Mil/uL   Hemoglobin 14.5 12.0 - 15.0 g/dL   HCT 11.9 14.7 - 82.9 %   MCV 89.7 78.0 - 100.0 fl   MCHC 35.2 30.0 - 36.0 g/dL   RDW 56.2 13.0 - 86.5 %   Platelets 197.0 150.0 - 400.0 K/uL   Neutrophils Relative % 51.6 43.0 - 77.0 %   Lymphocytes Relative 33.0 12.0 - 46.0 %   Monocytes Relative 12.8 (H) 3.0 - 12.0 %   Eosinophils Relative 1.9 0.0 - 5.0 %   Basophils Relative 0.7 0.0 - 3.0 %   Neutro Abs 1.8 1.4 - 7.7 K/uL   Lymphs Abs 1.2 0.7 - 4.0 K/uL   Monocytes Absolute 0.5 0.1 - 1.0 K/uL   Eosinophils Absolute 0.1 0.0 - 0.7 K/uL   Basophils Absolute 0.0 0.0 - 0.1 K/uL  Comprehensive metabolic panel  Result Value Ref Range   Sodium 138 135 - 145 mEq/L   Potassium 4.0 3.5 - 5.1 mEq/L   Chloride 103 96 - 112 mEq/L   CO2 30 19 - 32 mEq/L   Glucose, Bld 91 70 - 99 mg/dL   BUN 23 6 - 23 mg/dL   Creatinine, Ser 7.84 0.40 - 1.20 mg/dL   Total Bilirubin 0.4 0.2 - 1.2 mg/dL   Alkaline Phosphatase 86 39 - 117 U/L   AST 21 0 - 37 U/L   ALT 19 0 - 35 U/L   Total Protein 7.1 6.0 - 8.3 g/dL   Albumin 3.9 3.5 - 5.2 g/dL   Calcium 9.3 8.4 - 69.6 mg/dL   GFR 29.52 >84.13 mL/min  Lipid panel  Result Value Ref Range   Cholesterol 170 0 - 200 mg/dL   Triglycerides 244.0 0.0 - 149.0 mg/dL   HDL 10.27 >25.36 mg/dL   VLDL 64.4 0.0 - 03.4 mg/dL   LDL Cholesterol 742 (H) 0 - 99 mg/dL   Total CHOL/HDL Ratio 4    NonHDL 126.63   HIV antibody  Result Value Ref Range   HIV 1&2 Ab, 4th Generation NON-REACTIVE  NON-REACTI  Hemoglobin A1c  Result Value Ref Range   Hgb A1c MFr Bld 5.1 4.6 - 6.5 %  T4, free  Result Value Ref Range   Free T4 1.22 0.60 -  1.60 ng/dL  T3, free  Result Value Ref Range   T3, Free 3.1 2.3 - 4.2 pg/mL   EXAM: LUMBAR SPINE - 2-3 VIEW  FINDINGS: Chronic appearing L1 superior endplate compression fracture with mild anterior wedging and 40% loss of height anteriorly. Slight kyphosis at this level without retropulsion of the L1 vertebra.  No other compression fracture or significant disc space narrowing noted.  Trace vascular calcifications.  IMPRESSION: Chronic appearing L1 compression fracture as detailed above.  Assessment and Plan:   Katelin was seen today for establish care.  Diagnoses and all orders for this visit:  Hypothyroidism following radioiodine therapy Comments: Controlled.  Orders: -     TSH -     levothyroxine (SYNTHROID, LEVOTHROID) 200 MCG tablet; TAKE ONE TABLET BY MOUTH DAILY BEFORE BREAKFAST -     T4, free -     T3, free  Recurrent major depressive disorder, in partial remission (HCC)  Anxiety -     sertraline (ZOLOFT) 100 MG tablet; Take 1 tablet (100 mg total) by mouth daily.  Non-seasonal allergic rhinitis due to pollen -     fluticasone (FLONASE) 50 MCG/ACT nasal spray; 2 SPRAYS BOTH NOSTRILS DAILY  Scalp psoriasis -     Fluocinolone Acetonide Body 0.01 % OIL; Apply 1 application topically at bedtime.  Weight gain Comments: Fit at age 40. Bootcamp 5 days per week. Started gaining after marriage. Not exercsing now.  Orders: -     CBC with Differential/Platelet -     Comprehensive metabolic panel -     Lipid panel -     Hemoglobin A1c  Lumbar pain -     DG Lumbar Spine 2-3 Views -     cyclobenzaprine (FLEXERIL) 10 MG tablet; Take 1 tablet (10 mg total) by mouth 3 (three) times daily as needed for muscle spasms. -     ketorolac (TORADOL) 10 MG tablet; Take 1 tablet (10 mg total) by mouth every 6 (six) hours as  needed.  Screening for HIV (human immunodeficiency virus) -     HIV antibody  Screening for lipid disorders -     Lipid panel  History of endometrial polyp, s/p hysteroscopy and ablation  Oral contraceptive use  Closed compression fracture of first lumbar vertebra, initial encounter (HCC) Comments: Chronic L1 compression fracture. New diagnosis.     . Reviewed expectations re: course of current medical issues. . Discussed self-management of symptoms. . Outlined signs and symptoms indicating need for more acute intervention. . Patient verbalized understanding and all questions were answered. Marland Kitchen Health Maintenance issues including appropriate healthy diet, exercise, and smoking avoidance were discussed with patient. . See orders for this visit as documented in the electronic medical record. . Patient received an After Visit Summary.   Helane Rima, DO [place], [facility] 08/14/2017

## 2017-08-12 ENCOUNTER — Encounter: Payer: Self-pay | Admitting: Family Medicine

## 2017-08-12 ENCOUNTER — Ambulatory Visit: Payer: BC Managed Care – PPO | Admitting: Family Medicine

## 2017-08-12 ENCOUNTER — Ambulatory Visit (INDEPENDENT_AMBULATORY_CARE_PROVIDER_SITE_OTHER): Payer: BC Managed Care – PPO

## 2017-08-12 VITALS — BP 120/88 | HR 90 | Temp 98.6°F | Ht 69.0 in | Wt 203.0 lb

## 2017-08-12 DIAGNOSIS — S32010A Wedge compression fracture of first lumbar vertebra, initial encounter for closed fracture: Secondary | ICD-10-CM | POA: Diagnosis not present

## 2017-08-12 DIAGNOSIS — J301 Allergic rhinitis due to pollen: Secondary | ICD-10-CM | POA: Diagnosis not present

## 2017-08-12 DIAGNOSIS — N840 Polyp of corpus uteri: Secondary | ICD-10-CM | POA: Diagnosis not present

## 2017-08-12 DIAGNOSIS — M545 Low back pain, unspecified: Secondary | ICD-10-CM

## 2017-08-12 DIAGNOSIS — F419 Anxiety disorder, unspecified: Secondary | ICD-10-CM

## 2017-08-12 DIAGNOSIS — E890 Postprocedural hypothyroidism: Secondary | ICD-10-CM

## 2017-08-12 DIAGNOSIS — E663 Overweight: Secondary | ICD-10-CM | POA: Insufficient documentation

## 2017-08-12 DIAGNOSIS — Z114 Encounter for screening for human immunodeficiency virus [HIV]: Secondary | ICD-10-CM | POA: Diagnosis not present

## 2017-08-12 DIAGNOSIS — F3341 Major depressive disorder, recurrent, in partial remission: Secondary | ICD-10-CM | POA: Diagnosis not present

## 2017-08-12 DIAGNOSIS — L409 Psoriasis, unspecified: Secondary | ICD-10-CM | POA: Diagnosis not present

## 2017-08-12 DIAGNOSIS — Z13220 Encounter for screening for lipoid disorders: Secondary | ICD-10-CM | POA: Diagnosis not present

## 2017-08-12 DIAGNOSIS — Z3041 Encounter for surveillance of contraceptive pills: Secondary | ICD-10-CM

## 2017-08-12 DIAGNOSIS — N921 Excessive and frequent menstruation with irregular cycle: Secondary | ICD-10-CM | POA: Insufficient documentation

## 2017-08-12 DIAGNOSIS — R635 Abnormal weight gain: Secondary | ICD-10-CM

## 2017-08-12 DIAGNOSIS — F411 Generalized anxiety disorder: Secondary | ICD-10-CM | POA: Insufficient documentation

## 2017-08-12 LAB — CBC WITH DIFFERENTIAL/PLATELET
Basophils Absolute: 0 10*3/uL (ref 0.0–0.1)
Basophils Relative: 0.7 % (ref 0.0–3.0)
Eosinophils Absolute: 0.1 10*3/uL (ref 0.0–0.7)
Eosinophils Relative: 1.9 % (ref 0.0–5.0)
HCT: 41.3 % (ref 36.0–46.0)
Hemoglobin: 14.5 g/dL (ref 12.0–15.0)
Lymphocytes Relative: 33 % (ref 12.0–46.0)
Lymphs Abs: 1.2 10*3/uL (ref 0.7–4.0)
MCHC: 35.2 g/dL (ref 30.0–36.0)
MCV: 89.7 fl (ref 78.0–100.0)
Monocytes Absolute: 0.5 10*3/uL (ref 0.1–1.0)
Monocytes Relative: 12.8 % — ABNORMAL HIGH (ref 3.0–12.0)
Neutro Abs: 1.8 10*3/uL (ref 1.4–7.7)
Neutrophils Relative %: 51.6 % (ref 43.0–77.0)
Platelets: 197 10*3/uL (ref 150.0–400.0)
RBC: 4.6 Mil/uL (ref 3.87–5.11)
RDW: 12.5 % (ref 11.5–15.5)
WBC: 3.6 10*3/uL — ABNORMAL LOW (ref 4.0–10.5)

## 2017-08-12 LAB — LIPID PANEL
Cholesterol: 170 mg/dL (ref 0–200)
HDL: 43.7 mg/dL (ref >39.00)
LDL Cholesterol: 100 mg/dL — ABNORMAL HIGH (ref 0–99)
NonHDL: 126.63
Total CHOL/HDL Ratio: 4
Triglycerides: 132 mg/dL (ref 0.0–149.0)
VLDL: 26.4 mg/dL (ref 0.0–40.0)

## 2017-08-12 LAB — COMPREHENSIVE METABOLIC PANEL
ALT: 19 U/L (ref 0–35)
AST: 21 U/L (ref 0–37)
Albumin: 3.9 g/dL (ref 3.5–5.2)
Alkaline Phosphatase: 86 U/L (ref 39–117)
BUN: 23 mg/dL (ref 6–23)
CO2: 30 mEq/L (ref 19–32)
Calcium: 9.3 mg/dL (ref 8.4–10.5)
Chloride: 103 mEq/L (ref 96–112)
Creatinine, Ser: 0.9 mg/dL (ref 0.40–1.20)
GFR: 69.57 mL/min (ref >60.00)
Glucose, Bld: 91 mg/dL (ref 70–99)
Potassium: 4 mEq/L (ref 3.5–5.1)
Sodium: 138 mEq/L (ref 135–145)
Total Bilirubin: 0.4 mg/dL (ref 0.2–1.2)
Total Protein: 7.1 g/dL (ref 6.0–8.3)

## 2017-08-12 LAB — T4, FREE: Free T4: 1.22 ng/dL (ref 0.60–1.60)

## 2017-08-12 LAB — HEMOGLOBIN A1C: Hgb A1c MFr Bld: 5.1 % (ref 4.6–6.5)

## 2017-08-12 LAB — T3, FREE: T3, Free: 3.1 pg/mL (ref 2.3–4.2)

## 2017-08-12 LAB — TSH: TSH: 0.22 u[IU]/mL — ABNORMAL LOW (ref 0.35–4.50)

## 2017-08-12 MED ORDER — SERTRALINE HCL 100 MG PO TABS: 100.0000 mg | ORAL_TABLET | Freq: Every day | ORAL | 2 refills | Status: DC

## 2017-08-12 MED ORDER — KETOROLAC TROMETHAMINE 10 MG PO TABS: 10.0000 mg | ORAL_TABLET | Freq: Four times a day (QID) | ORAL | 0 refills | Status: DC | PRN

## 2017-08-12 MED ORDER — LEVOTHYROXINE SODIUM 200 MCG PO TABS: ORAL_TABLET | ORAL | 2 refills | Status: DC

## 2017-08-12 MED ORDER — CYCLOBENZAPRINE HCL 10 MG PO TABS
10.0000 mg | ORAL_TABLET | Freq: Three times a day (TID) | ORAL | 0 refills | Status: DC | PRN
Start: 2017-08-12 — End: 2018-01-26

## 2017-08-12 MED ORDER — FLUTICASONE PROPIONATE 50 MCG/ACT NA SUSP: NASAL | 4 refills | Status: AC

## 2017-08-12 MED ORDER — FLUOCINOLONE ACETONIDE BODY 0.01 % EX OIL: 1.0000 "application " | TOPICAL_OIL | Freq: Every day | CUTANEOUS | 0 refills | Status: DC

## 2017-08-12 NOTE — Patient Instructions (Signed)
MANAGEMENT OF CHRONIC CONSTIPATION   Push fluids, all day, everyday  Eat lots of high fiber foods-fruits, veggies, bran and whole grain instead of white bread  Be active everyday. Inactivity makes constipation worse.  Add psyllium daily (Metamucil) which comes in capsules now. Start very low dose and work up to recommended dose on bottle daily.  Stay away from Milk of Magnesia or any magnesium containing laxative, unless you need it to clear things out rarely. It is an addictive laxative and your gut will become dependent on it.  If that is not working, I would start Miralax, which you can buy in generic 17 gms daily. It's a powder and not an "addictive laxative". Take it every day and titrate the dose up or down to get the daily BM.  

## 2017-08-13 LAB — HIV ANTIBODY (ROUTINE TESTING W REFLEX): HIV 1&2 Ab, 4th Generation: NONREACTIVE

## 2017-08-14 MED ORDER — CALCITONIN (SALMON) 200 UNIT/ACT NA SOLN: 1.0000 | Freq: Every day | NASAL | 0 refills | Status: AC

## 2017-08-26 ENCOUNTER — Ambulatory Visit
Admission: RE | Admit: 2017-08-26 | Discharge: 2017-08-26 | Disposition: A | Discharge status: Home / Self Care | Payer: BC Managed Care – PPO | Source: Ambulatory Visit | Attending: Family Medicine | Admitting: Family Medicine

## 2017-08-26 DIAGNOSIS — S32010A Wedge compression fracture of first lumbar vertebra, initial encounter for closed fracture: Secondary | ICD-10-CM

## 2017-08-27 ENCOUNTER — Other Ambulatory Visit: Payer: Self-pay

## 2017-08-27 ENCOUNTER — Encounter: Payer: Self-pay | Admitting: Family Medicine

## 2017-08-27 DIAGNOSIS — M545 Low back pain, unspecified: Secondary | ICD-10-CM

## 2017-08-28 NOTE — Telephone Encounter (Signed)
Called patient referral changed and patient aware. No questions or problems

## 2017-08-29 ENCOUNTER — Encounter: Payer: Self-pay | Admitting: Family Medicine

## 2017-10-07 LAB — HM MAMMOGRAPHY

## 2018-01-26 ENCOUNTER — Encounter: Payer: Self-pay | Admitting: Family Medicine

## 2018-01-26 ENCOUNTER — Other Ambulatory Visit: Payer: Self-pay | Admitting: Family Medicine

## 2018-01-26 DIAGNOSIS — M545 Low back pain, unspecified: Secondary | ICD-10-CM

## 2018-01-27 MED ORDER — CYCLOBENZAPRINE HCL 10 MG PO TABS
10.0000 mg | ORAL_TABLET | Freq: Three times a day (TID) | ORAL | 0 refills | Status: DC | PRN
Start: 2018-01-27 — End: 2018-11-05

## 2018-01-27 NOTE — Telephone Encounter (Signed)
Rx refilled.

## 2018-04-15 ENCOUNTER — Ambulatory Visit: Payer: BC Managed Care – PPO | Admitting: Family Medicine

## 2018-04-15 ENCOUNTER — Encounter: Payer: Self-pay | Admitting: Family Medicine

## 2018-04-15 VITALS — BP 138/84 | HR 82 | Temp 97.8°F | Ht 69.0 in | Wt 207.0 lb

## 2018-04-15 DIAGNOSIS — J101 Influenza due to other identified influenza virus with other respiratory manifestations: Secondary | ICD-10-CM | POA: Diagnosis not present

## 2018-04-15 LAB — POC INFLUENZA A&B (BINAX/QUICKVUE)
Influenza A, POC: POSITIVE — AB
Influenza B, POC: NEGATIVE

## 2018-04-15 MED ORDER — BENZONATATE 200 MG PO CAPS: 200.0000 mg | ORAL_CAPSULE | Freq: Two times a day (BID) | ORAL | 0 refills | Status: DC | PRN

## 2018-04-15 NOTE — Patient Instructions (Signed)
Cool mist humidifier at night flonase at night Tessalon pearls prn for cough Robitussin DM nyquil at night   Influenza, Adult Influenza is also called "the flu." It is an infection in the lungs, nose, and throat (respiratory tract). It is caused by a virus. The flu causes symptoms that are similar to symptoms of a cold. It also causes a high fever and body aches. The flu spreads easily from person to person (is contagious). Getting a flu shot (influenza vaccination) every year is the best way to prevent the flu. What are the causes? This condition is caused by the influenza virus. You can get the virus by:  Breathing in droplets that are in the air from the cough or sneeze of a person who has the virus.  Touching something that has the virus on it (is contaminated) and then touching your mouth, nose, or eyes. What increases the risk? Certain things may make you more likely to get the flu. These include:  Not washing your hands often.  Having close contact with many people during cold and flu season.  Touching your mouth, eyes, or nose without first washing your hands.  Not getting a flu shot every year. You may have a higher risk for the flu, along with serious problems such as a lung infection (pneumonia), if you:  Are older than 65.  Are pregnant.  Have a weakened disease-fighting system (immune system) because of a disease or taking certain medicines.  Have a long-term (chronic) illness, such as: ? Heart, kidney, or lung disease. ? Diabetes. ? Asthma.  Have a liver disorder.  Are very overweight (morbidly obese).  Have anemia. This is a condition that affects your red blood cells. What are the signs or symptoms? Symptoms usually begin suddenly and last 4-14 days. They may include:  Fever and chills.  Headaches, body aches, or muscle aches.  Sore throat.  Cough.  Runny or stuffy (congested) nose.  Chest discomfort.  Not wanting to eat as much as normal  (poor appetite).  Weakness or feeling tired (fatigue).  Dizziness.  Feeling sick to your stomach (nauseous) or throwing up (vomiting). How is this treated? If the flu is found early, you can be treated with medicine that can help reduce how bad the illness is and how long it lasts (antiviral medicine). This may be given by mouth (orally) or through an IV tube. Taking care of yourself at home can help your symptoms get better. Your doctor may suggest:  Taking over-the-counter medicines.  Drinking plenty of fluids. The flu often goes away on its own. If you have very bad symptoms or other problems, you may be treated in a hospital. Follow these instructions at home:     Activity  Rest as needed. Get plenty of sleep.  Stay home from work or school as told by your doctor. ? Do not leave home until you do not have a fever for 24 hours without taking medicine. ? Leave home only to visit your doctor. Eating and drinking  Take an ORS (oral rehydration solution). This is a drink that is sold at pharmacies and stores.  Drink enough fluid to keep your pee (urine) pale yellow.  Drink clear fluids in small amounts as you are able. Clear fluids include: ? Water. ? Ice chips. ? Fruit juice that has water added (diluted fruit juice). ? Low-calorie sports drinks.  Eat bland, easy-to-digest foods in small amounts as you are able. These foods include: ? Bananas. ? Applesauce. ?  Rice. ? Lean meats. ? Toast. ? Crackers.  Do not eat or drink: ? Fluids that have a lot of sugar or caffeine. ? Alcohol. ? Spicy or fatty foods. General instructions  Take over-the-counter and prescription medicines only as told by your doctor.  Use a cool mist humidifier to add moisture to the air in your home. This can make it easier for you to breathe.  Cover your mouth and nose when you cough or sneeze.  Wash your hands with soap and water often, especially after you cough or sneeze. If you cannot  use soap and water, use alcohol-based hand sanitizer.  Keep all follow-up visits as told by your doctor. This is important. How is this prevented?   Get a flu shot every year. You may get the flu shot in late summer, fall, or winter. Ask your doctor when you should get your flu shot.  Avoid contact with people who are sick during fall and winter (cold and flu season). Contact a doctor if:  You get new symptoms.  You have: ? Chest pain. ? Watery poop (diarrhea). ? A fever.  Your cough gets worse.  You start to have more mucus.  You feel sick to your stomach.  You throw up. Get help right away if you:  Have shortness of breath.  Have trouble breathing.  Have skin or nails that turn a bluish color.  Have very bad pain or stiffness in your neck.  Get a sudden headache.  Get sudden pain in your face or ear.  Cannot eat or drink without throwing up. Summary  Influenza ("the flu") is an infection in the lungs, nose, and throat. It is caused by a virus.  Take over-the-counter and prescription medicines only as told by your doctor.  Getting a flu shot every year is the best way to avoid getting the flu. This information is not intended to replace advice given to you by your health care provider. Make sure you discuss any questions you have with your health care provider. Document Released: 11/08/2007 Document Revised: 07/17/2017 Document Reviewed: 07/17/2017 Elsevier Interactive Patient Education  2019 [employer].

## 2018-04-15 NOTE — Progress Notes (Signed)
Patient: Sara Hull MRN: [id number] DOB: 1965/07/27 PCP: Helane Rima, DO     Subjective:  Chief Complaint  Patient presents with  . Cough  . chest congestion    HPI: The patient is a 53 y.o. female who presents today for cough and cold. She thinks she has a cold, but her husband has had a kidney transplant and wants to make sure. She was at a conference and sat by a woman who was diagnosed with the flu. She has not had a fever/body aches or chills. She has had her flu shot this year. She started feeling bad on Friday with sinus issues. She states her cough started Sunday. Yesterday was a bad day, but today is better. She has no history of asthma/copd. She has no hx of smoking. She denies any shortness of breath/wheezing.  She also works with babies-three. She has taken robitussin, mucinex D, claritin and flonase.   Review of Systems  Constitutional: Positive for fatigue. Negative for chills and fever.  HENT: Positive for congestion, postnasal drip, rhinorrhea and sinus pressure. Negative for ear pain, sinus pain, sore throat and trouble swallowing.   Eyes: Negative for photophobia and visual disturbance.  Respiratory: Positive for cough. Negative for chest tightness, shortness of breath and wheezing.   Cardiovascular: Negative for chest pain.  Gastrointestinal: Negative for abdominal pain and nausea.  Musculoskeletal: Positive for back pain.       H/o bulging disc in lumbar area of spine  Neurological: Negative for dizziness and headaches.  Psychiatric/Behavioral: Negative for sleep disturbance.    Allergies Patient has No Known Allergies.    Past Medical History Patient  has a past medical history of Allergic rhinitis, Dysthymia, Hypothyroidism, and Rosacea.  Surgical History Patient  has a past surgical history that includes Wisdom tooth extraction; Hysteroscopy w/D&C (N/A, 09/24/2014); and Hysteroscopy (04/2015).  Family History Pateint's family history includes  Bipolar disorder in her mother; Cancer in her father; Colon polyps in her father; Hyperlipidemia in her father; Mental illness in her brother; Other in her mother.  Social History Patient  reports that she has never smoked. She has never used smokeless tobacco. She reports current alcohol use. She reports that she does not use drugs.    Objective: Vitals:   04/15/18 1358  BP: 138/84  Pulse: 82  Temp: 97.8 F (36.6 C)  TempSrc: Oral  SpO2: 99%  Weight: 207 lb (93.9 kg)  Height: 5\' 9"  (1.753 m)    Body mass index is 30.57 kg/m.  Physical Exam Vitals signs reviewed.  Constitutional:      Appearance: Normal appearance.  HENT:     Right Ear: Tympanic membrane, ear canal and external ear normal.     Left Ear: Tympanic membrane, ear canal and external ear normal.     Nose: Congestion and rhinorrhea present.     Mouth/Throat:     Mouth: Mucous membranes are moist.  Eyes:     Extraocular Movements: Extraocular movements intact.     Pupils: Pupils are equal, round, and reactive to light.  Neck:     Musculoskeletal: Normal range of motion and neck supple.  Cardiovascular:     Rate and Rhythm: Normal rate and regular rhythm.     Heart sounds: Normal heart sounds.  Pulmonary:     Effort: Pulmonary effort is normal. No respiratory distress.     Breath sounds: Normal breath sounds. No wheezing or rales.  Abdominal:     General: Abdomen is flat. Bowel sounds are  normal.     Palpations: Abdomen is soft.  Lymphadenopathy:     Cervical: No cervical adenopathy.  Skin:    Capillary Refill: Capillary refill takes less than 2 seconds.  Neurological:     General: No focal deficit present.     Mental Status: She is alert and oriented to person, place, and time.        Influenza A POSITIVE   Assessment/plan: 1. Influenza A Outside of treatment window for tamiflu. Continue conservative treatment. Encouraged rest, fluids, otc cough medication and also recommended honey, cool mist  humidifier and flonase nightly. Also sending in tessalon pearls prn. Precautions given for worsening symptoms.  - POC Influenza A&B(BINAX/QUICKVUE)    Return if symptoms worsen or fail to improve.   Orland Mustard, MD [facility]   04/15/2018

## 2018-05-01 ENCOUNTER — Ambulatory Visit: Payer: Self-pay | Admitting: Family Medicine

## 2018-05-01 NOTE — Telephone Encounter (Signed)
I returned call and pt's husband answered the phone because Sara Hull was in the shower.   He was answering my questions until she got out of the shower and got on the phone.  See the triage notes.    She was dx with the flu on 04/15/2018.  She will has the cough.   She is coughing up small amts of clear, thin mucus.   No fever or shortness of breath.    She spent last weekend cleaning out her mother's house to prepare it to sale.    She was exposed to 30 years worth of dust.   That's when the cough got worse because it was better before her dust exposure.  I went over the home care advice with her.   Her husband is a kidney transplant pt so they are being very careful due to that.   I let her know to call us back if she develops any fever, shortness of breath, chest tightness, coughing up yellow or green mucus.   She was agreeable to this plan.   She wanted reassurance too with the coronavirus pandemic going on.    Reason for Disposition . Cough  Answer Assessment - Initial Assessment Questions 1. ONSET: "When did the cough begin?"      Diagnosed with the flu A 04/15/2018.   2. SEVERITY: "How bad is the cough today?"      She has inhaled dust form cleaning mother's house at [place], [place]. Husband is answering questions.   Pt in the shower.   He is a kidney transplant pt.   So worried about illness. 3. RESPIRATORY DISTRESS: "Describe your breathing."      No.   Talking causes coughing.   She feels tired.    4. FEVER: "Do you have a fever?" If so, ask: "What is your temperature, how was it measured, and when did it start?"     No 5. SPUTUM: "Describe the color of your sputum" (clear, white, yellow, green)     The cough went away for 3 days and then this weekend it started again after being exposed to 30 years of dust cleaning my mother's house. It's a rattled cough.     I'm coughing up clear and thin.   I have the Tessalon Perles from the flu.   I'm using Mucinex D and Claritin daily and flonase.    I  steamed my head last night over boiling water with a towel over my head.    6. HEMOPTYSIS: "Are you coughing up any blood?" If so ask: "How much?" (flecks, streaks, tablespoons, etc.)     No 7. CARDIAC HISTORY: "Do you have any history of heart disease?" (e.g., heart attack, congestive heart failure)      No 8. LUNG HISTORY: "Do you have any history of lung disease?"  (e.g., pulmonary embolus, asthma, emphysema)     No. 9. PE RISK FACTORS: "Do you have a history of blood clots?" (or: recent major surgery, recent prolonged travel, bedridden)     No 10. OTHER SYMPTOMS: "Do you have any other symptoms?" (e.g., runny nose, wheezing, chest pain)       No chest pain.   Sometimes wheezy but I have seasonal allergies too and the windows were open when cleaning my mother's house. 11. PREGNANCY: "Is there any chance you are pregnant?" "When was your last menstrual period?"       No   Ablation 12. TRAVEL: "Have you traveled out of the country  in the last month?" (e.g., travel history, exposures)       No travels.   I am going into the office.   We social distancing.  Protocols used: COUGH - ACUTE PRODUCTIVE-A-AH

## 2018-05-05 ENCOUNTER — Encounter: Payer: Self-pay | Admitting: Family Medicine

## 2018-05-05 MED ORDER — AZITHROMYCIN 250 MG PO TABS
ORAL_TABLET | ORAL | 0 refills | Status: DC
Start: 2018-05-05 — End: 2018-05-06

## 2018-05-06 ENCOUNTER — Ambulatory Visit (INDEPENDENT_AMBULATORY_CARE_PROVIDER_SITE_OTHER): Payer: BC Managed Care – PPO | Admitting: Family Medicine

## 2018-05-06 ENCOUNTER — Encounter: Payer: Self-pay | Admitting: Family Medicine

## 2018-05-06 VITALS — Temp 96.6°F | Wt 202.6 lb

## 2018-05-06 DIAGNOSIS — J208 Acute bronchitis due to other specified organisms: Secondary | ICD-10-CM

## 2018-05-06 DIAGNOSIS — B9689 Other specified bacterial agents as the cause of diseases classified elsewhere: Secondary | ICD-10-CM | POA: Diagnosis not present

## 2018-05-06 DIAGNOSIS — J329 Chronic sinusitis, unspecified: Secondary | ICD-10-CM

## 2018-05-06 MED ORDER — PREDNISONE 5 MG PO TABS: ORAL_TABLET | ORAL | 0 refills | Status: DC

## 2018-05-06 MED ORDER — AZITHROMYCIN 250 MG PO TABS: ORAL_TABLET | ORAL | 0 refills | Status: DC

## 2018-05-06 NOTE — Progress Notes (Deleted)
Virtual Visit via Video   I connected with Sara Hull on 05/06/18 at  9:20 AM EDT by a video enabled telemedicine application and verified that I am speaking with the correct person using two identifiers. Location patient: Home Location provider: [facility], Office Persons participating in the virtual visit: Helane Rima, DO, Maurine Simmering as CMA scribe.   I discussed the limitations of evaluation and management by telemedicine and the availability of in person appointments. The patient expressed understanding and agreed to proceed.  Subjective:   HPI: ***  ROS: See pertinent positives and negatives per HPI.  Patient Active Problem List   Diagnosis Date Noted  . Overweight with body mass index (BMI) 25.0-29.9 08/12/2017  . Anxiety 08/12/2017  . Lumbar pain 08/12/2017  . Oral contraceptive use 08/12/2017  . History of endometrial polyp, s/p hysteroscopy and ablation 08/12/2017  . Scalp psoriasis, Rx fluocinolone oil 01/10/2015  . Hypothyroidism, postradioiodine therapy 11/25/2009  . Dysthymia, on Zoloft 11/25/2009  . Allergic rhinitis, controlled wiht flonase 11/25/2009    Social History   Tobacco Use  . Smoking status: Never Smoker  . Smokeless tobacco: Never Used  Substance Use Topics  . Alcohol use: Yes    Comment: occ    Current Outpatient Medications:  .  benzonatate (TESSALON) 200 MG capsule, Take 1 capsule (200 mg total) by mouth 2 (two) times daily as needed for cough., Disp: 20 capsule, Rfl: 0 .  cyclobenzaprine (FLEXERIL) 10 MG tablet, Take 1 tablet (10 mg total) by mouth 3 (three) times daily as needed for muscle spasms., Disp: 30 tablet, Rfl: 0 .  Fluocinolone Acetonide Body 0.01 % OIL, Apply 1 application topically at bedtime., Disp: 1 Bottle, Rfl: 0 .  fluticasone (FLONASE) 50 MCG/ACT nasal spray, 2 SPRAYS BOTH NOSTRILS DAILY, Disp: 16 g, Rfl: 4 .  JUNEL 1/20 1-20 MG-MCG tablet, Take 1 tablet by mouth daily. , Disp: , Rfl:  .   levothyroxine (SYNTHROID, LEVOTHROID) 200 MCG tablet, TAKE ONE TABLET BY MOUTH DAILY BEFORE BREAKFAST, Disp: 90 tablet, Rfl: 2 .  Loratadine (CLARITIN) 10 MG CAPS, Claritin, Disp: , Rfl:  .  LOTEMAX 0.5 % ophthalmic suspension, , Disp: , Rfl:  .  metroNIDAZOLE (METROGEL) 0.75 % gel, Apply 1 application topically as needed. , Disp: , Rfl:  .  sertraline (ZOLOFT) 100 MG tablet, Take 1 tablet (100 mg total) by mouth daily., Disp: 90 tablet, Rfl: 2 .  Naproxen (NAPROSYN PO), naproxen, Disp: , Rfl:   No Known Allergies  Objective:   VITALS: Per patient if applicable: *** GENERAL: Alert, appears well and in no acute distress. HEENT: Atraumatic, conjunctiva clear, no obvious abnormalities on inspection of external nose and ears. NECK: Normal movements of the head and neck. CARDIOPULMONARY: No increased WOB. Speaking in clear sentences. I:E ratio WNL.  MS: Moves all visible extremities without noticeable abnormality. PSYCH: Pleasant and cooperative, well-groomed. Speech normal rate and rhythm. Affect is appropriate. Insight and judgement are appropriate. Attention is focused, linear, and appropriate.  NEURO: CN grossly intact. Oriented as arrived to appointment on time with no prompting. Moves both UE equally.  SKIN: No obvious lesions, wounds, erythema, or cyanosis noted on face or hands.  Assessment and Plan:   There are no diagnoses linked to this encounter.  . Reviewed expectations re: course of current medical issues. . Discussed self-management of symptoms. . Outlined signs and symptoms indicating need for more acute intervention. . Patient verbalized understanding and all questions were answered. Marland Kitchen  Health Maintenance issues including appropriate healthy diet, exercise, and smoking avoidance were discussed with patient. . See orders for this visit as documented in the electronic medical record.  Helane Rima, DO 05/06/2018

## 2018-05-06 NOTE — Patient Instructions (Signed)
Hand Washing Germs such as bacteria, viruses, and parasites are found everywhere. They can be in the air and water. They can also be on surfaces like food, door handles, and your skin. Every day, your hands touch germs. Many of these germs can make you and your family sick. Washing your hands is one of the best ways to lower your risk of getting and sharing germs. When should I wash my hands? You should wash your hands whenever you think they are dirty. You should also wash your hands:  Before: ? Visiting a baby or anyone with a weakened disease-fighting system (immunesystem). ? Putting in and taking out contact lenses.  After: ? Using the bathroom or helping someone else use the bathroom. ? Working or playing outside. ? Touching or taking out the garbage. ? Touching anything dirty around your home. ? Sneezing, coughing, or blowing your nose. ? Using a phone, including your mobile phone. ? Touching an animal, animal food, animal poop, or its toys or leash. ? Touching money. ? Using household cleaners or poisonous chemicals. ? Handling dirty clothes, bedding, or rags. ? Using public transportation. ? Going shopping, especially if you use a shopping cart or basket. ? Shaking hands. ? Handling livestock, such as cows or sheep.  Before and after: ? Preparing food. ? Eating. ? Visiting or taking care of someone who is sick. This includes touching used tissues, toys, and clothes. ? Changing a bandage (dressing). ? Taking care of an injury or wound. ? Giving or taking medicine. ? Preparing a bottle for a baby. ? Feeding a baby or young child. ? Changing a diaper. What is the right way to wash my hands?  1. Wet your hands with clean, running water. Turn off the water or move your hands out of the running water. 2. Apply liquid soap or bar soap to your hands. 3. Rub your hands together quickly to create lather. 4. Keep rubbing your hands together for at least 20 seconds. Thoroughly  scrub all parts of your hands. This includes scrubbing under your fingernails and between your fingers. 5. Rinse your hands with clean, running water. Do this until all the soap is gone. 6. Dry your hands using an air dryer or a clean paper or cloth towel, or let your hands air-dry. Do not use your clothing or a dirty towel to dry your hands. If you are in a public restroom, use your towel:  To turn off the water faucet.  To open the bathroom door. How can I clean my hands if I do not have soap and water?  If soap and clean water are not available, use a hand-washing wipe, spray, or gel (hand sanitizer). Use one that contains at least 60% alcohol. If you are handling food, gels are not recommended as a replacement for hand washing with soap and water. To use these products, follow the directions on the product, and:  Apply enough product to cover your hands.  Make sure you wipe, rub, or spray the product so that it reaches every part of your hands and wrists. Include the backs of your hands, between your fingers, and under your fingernails.  Rub the product onto your hands until it dries. Summary  Every day, your hands touch germs. Many of these germs can make you and your family sick.  Washing your hands is one of the best ways to lower your risk of getting and sharing germs.  If soap and clean water are not available,   use a hand-washing wipe, spray, or gel. This information is not intended to replace advice given to you by your health care provider. Make sure you discuss any questions you have with your health care provider. Document Released: 01/12/2008 Document Revised: 11/07/2016 Document Reviewed: 11/07/2016 Elsevier Interactive Patient Education  2019 [employer]vier Inc.  

## 2018-05-06 NOTE — Telephone Encounter (Signed)
Pt notified Rx was sent to pharmacy by [facility].

## 2018-05-06 NOTE — Progress Notes (Signed)
Virtual Visit via Video   I connected with Arii Gallegos  on 05/06/18 at  9:20 AM EDT by a video enabled telemedicine application and verified that I am speaking with the correct person using two identifiers. Location patient: Home Location provider: [facility], Office Persons participating in the virtual visit: Dr. Earlene Plater, Evette Doffing, Helane Rima, DO as scribe.  I discussed the limitations of evaluation and management by telemedicine and the availability of in person appointments. The patient expressed understanding and agreed to proceed.  Subjective:   PET:[id number] has had productive cough for over three weeks. Cough has been productive but in last few days has become less productive and more deep. she denies any fever, nausea or vomiting, head ache. She has had some sinus pressrue. She has not had any antibiotics. She has had tessalon and over the counter cough medications that have not helped much.   She has had some shortness of breath that had been improving over time. She denies any chest pain or pressure. She did have flu on 04/15/2018. Her sputum is clear. Her mom is in skilled nursing facility and there were working on getting her home ready for sell.    ROS: See pertinent positives and negatives per HPI.  Patient Active Problem List   Diagnosis Date Noted  . Overweight with body mass index (BMI) 25.0-29.9 08/12/2017  . Anxiety 08/12/2017  . Lumbar pain 08/12/2017  . Oral contraceptive use 08/12/2017  . History of endometrial polyp, s/p hysteroscopy and ablation 08/12/2017  . Scalp psoriasis, Rx fluocinolone oil 01/10/2015  . Hypothyroidism, postradioiodine therapy 11/25/2009  . Dysthymia, on Zoloft 11/25/2009  . Allergic rhinitis, controlled wiht flonase 11/25/2009    Social History   Tobacco Use  . Smoking status: Never Smoker  . Smokeless tobacco: Never Used  Substance Use Topics  . Alcohol use: Yes    Comment: occ   Current Outpatient Medications:  .   cyclobenzaprine (FLEXERIL) 10 MG tablet, Take 1 tablet (10 mg total) by mouth 3 (three) times daily as needed for muscle spasms., Disp: 30 tablet, Rfl: 0 .  Fluocinolone Acetonide Body 0.01 % OIL, Apply 1 application topically at bedtime., Disp: 1 Bottle, Rfl: 0 .  fluticasone (FLONASE) 50 MCG/ACT nasal spray, 2 SPRAYS BOTH NOSTRILS DAILY, Disp: 16 g, Rfl: 4 .  JUNEL 1/20 1-20 MG-MCG tablet, Take 1 tablet by mouth daily. , Disp: , Rfl:  .  levothyroxine (SYNTHROID, LEVOTHROID) 200 MCG tablet, TAKE ONE TABLET BY MOUTH DAILY BEFORE BREAKFAST, Disp: 90 tablet, Rfl: 2 .  Loratadine (CLARITIN) 10 MG CAPS, Claritin, Disp: , Rfl:  .  LOTEMAX 0.5 % ophthalmic suspension, , Disp: , Rfl:  .  metroNIDAZOLE (METROGEL) 0.75 % gel, Apply 1 application topically as needed. , Disp: , Rfl:  .  sertraline (ZOLOFT) 100 MG tablet, Take 1 tablet (100 mg total) by mouth daily., Disp: 90 tablet, Rfl: 2 .  Naproxen (NAPROSYN PO), naproxen, Disp: , Rfl:   No Known Allergies  Objective:   VITALS: Per patient if applicable, see vitals section. GENERAL: Alert, appears well and in no acute distress. HEENT: Atraumatic, conjunctiva clear, no obvious abnormalities on inspection of external nose and ears. NECK: Normal movements of the head and neck. CARDIOPULMONARY: No increased WOB. Speaking in clear sentences. I:E ratio WNL.  MS: Moves all visible extremities without noticeable abnormality. PSYCH: Pleasant and cooperative, well-groomed. Speech normal rate and rhythm. Affect is appropriate. Insight and judgement are appropriate. Attention is focused, linear, and appropriate.  NEURO: CN grossly intact. Oriented as arrived to appointment on time with no prompting. Moves both UE equally.  SKIN: No obvious lesions, wounds, erythema, or cyanosis noted on face or hands.  Assessment and Plan:   Annelies was seen today for cough.  Diagnoses and all orders for this visit:  Bacterial sinusitis -     azithromycin (ZITHROMAX) 250  MG tablet; Two tab daily and one a day after -     predniSONE (DELTASONE) 5 MG tablet; 6-5-4-3-2-1  Acute bacterial bronchitis   . Reviewed expectations re: course of current medical issues. . Discussed self-management of symptoms. . Outlined signs and symptoms indicating need for more acute intervention. . Patient verbalized understanding and all questions were answered. Marland Kitchen Health Maintenance issues including appropriate healthy diet, exercise, and smoking avoidance were discussed with patient. . See orders for this visit as documented in the electronic medical record.  Helane Rima, DO 05/06/2018

## 2018-05-13 ENCOUNTER — Encounter: Payer: Self-pay | Admitting: Family Medicine

## 2018-05-26 ENCOUNTER — Other Ambulatory Visit: Payer: Self-pay

## 2018-05-26 DIAGNOSIS — E890 Postprocedural hypothyroidism: Secondary | ICD-10-CM

## 2018-05-27 MED ORDER — LEVOTHYROXINE SODIUM 200 MCG PO TABS: ORAL_TABLET | ORAL | 2 refills | Status: DC

## 2018-07-16 ENCOUNTER — Other Ambulatory Visit: Payer: Self-pay

## 2018-07-16 ENCOUNTER — Encounter: Payer: Self-pay | Admitting: Family Medicine

## 2018-07-16 DIAGNOSIS — F419 Anxiety disorder, unspecified: Secondary | ICD-10-CM

## 2018-07-16 MED ORDER — SERTRALINE HCL 100 MG PO TABS: 100.0000 mg | ORAL_TABLET | Freq: Every day | ORAL | 0 refills | Status: DC

## 2018-08-26 ENCOUNTER — Encounter: Payer: Self-pay | Admitting: Family Medicine

## 2018-08-26 ENCOUNTER — Other Ambulatory Visit: Payer: Self-pay | Admitting: Family Medicine

## 2018-08-26 DIAGNOSIS — E890 Postprocedural hypothyroidism: Secondary | ICD-10-CM

## 2018-08-26 MED ORDER — LEVOTHYROXINE SODIUM 200 MCG PO TABS: ORAL_TABLET | ORAL | 2 refills | Status: DC

## 2018-08-26 NOTE — Telephone Encounter (Signed)
Pt requesting refill on Levothyroxine. Labs have not been done since 08/2017. Please advise.

## 2018-08-30 IMAGING — DX DG LUMBAR SPINE 2-3V
3 series · 3 of 3 positions shown · non-contrast
Comparison: None.

CLINICAL DATA: 52-year-old female with chronic low back pain for 6
months radiating to buttock. No known injury. Initial encounter.

EXAM:
LUMBAR SPINE - 2-3 VIEW

[lumbar spine ap]
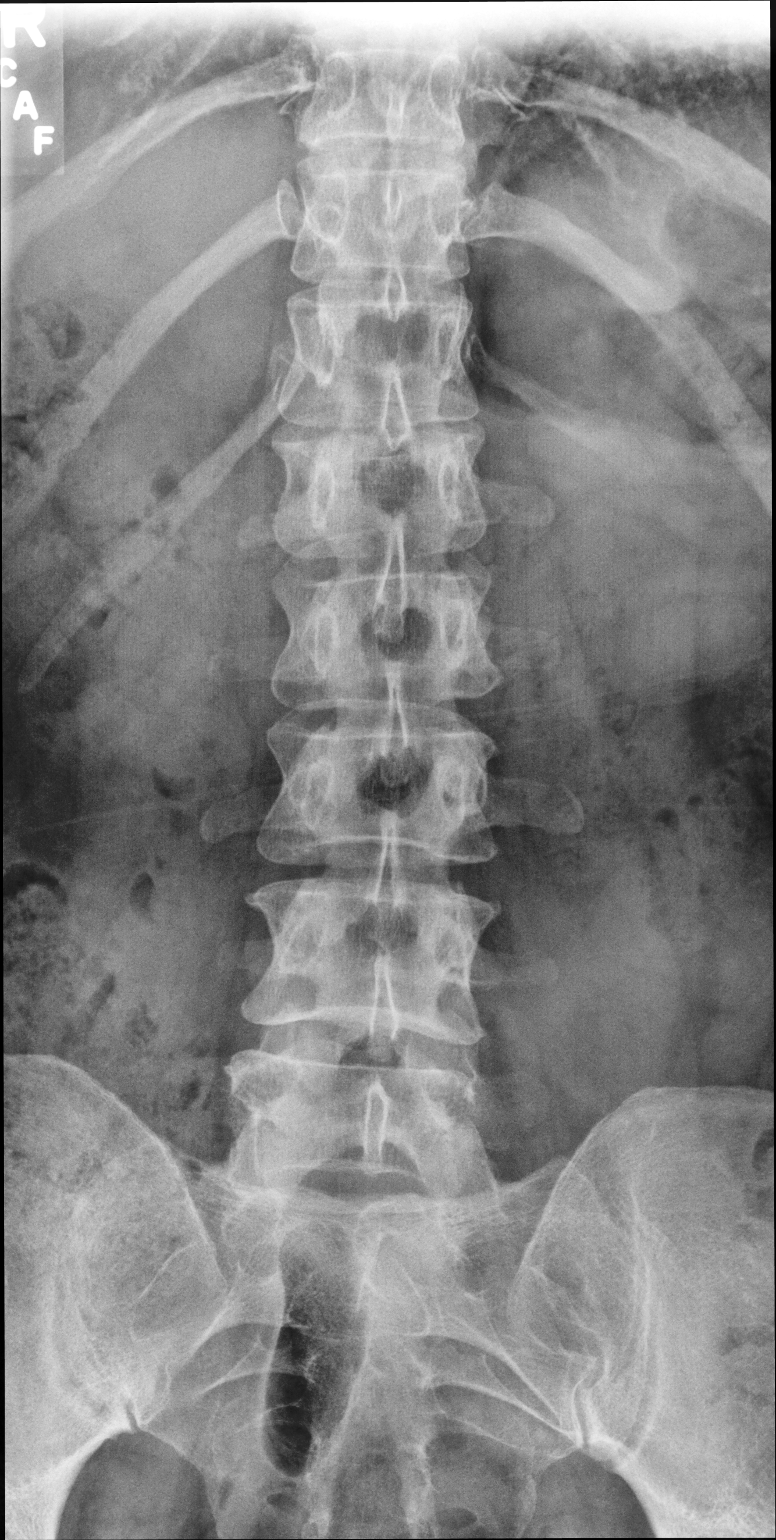

[lumbar spine lat (1 of 2)]
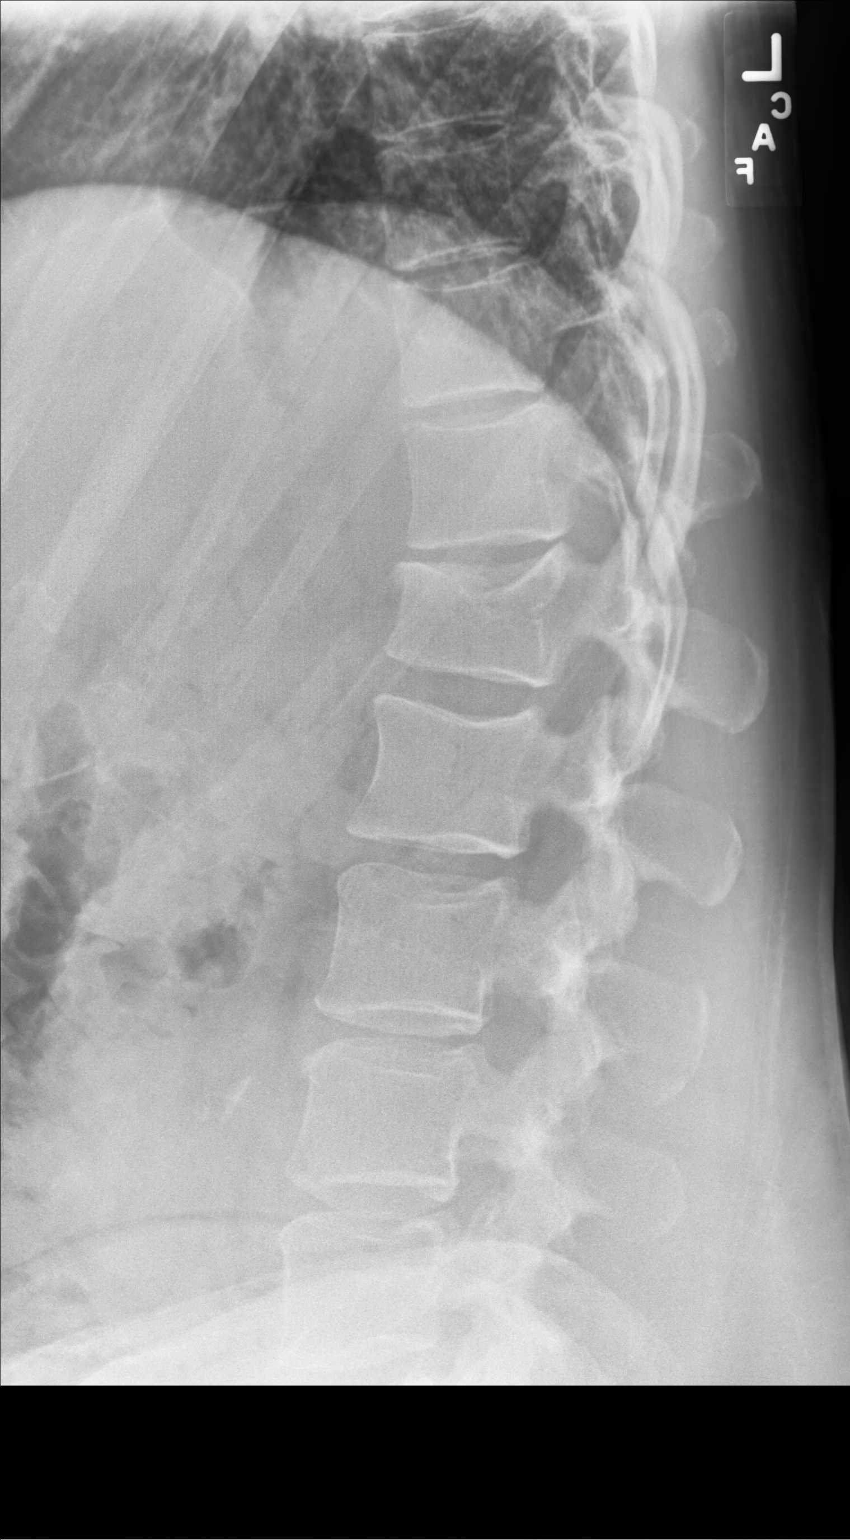

[lumbar spine lat (2 of 2)]
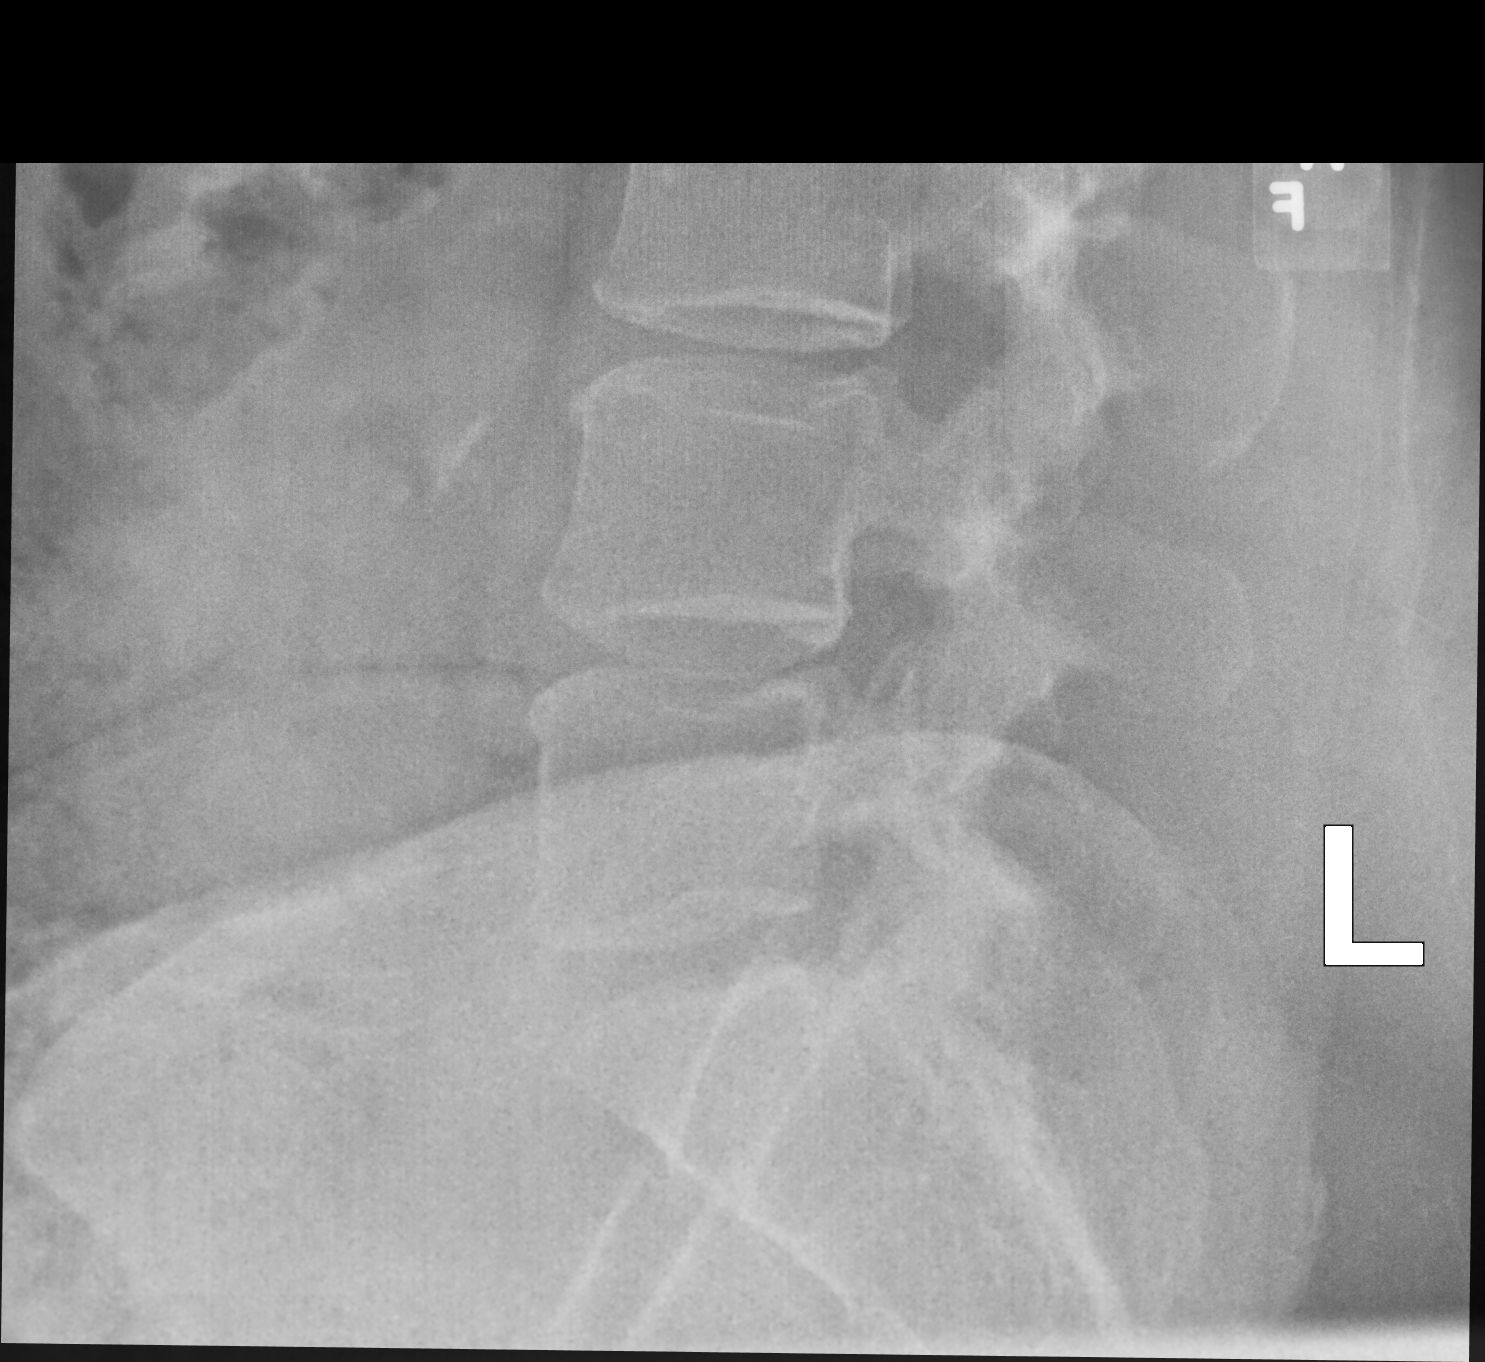

[3 of 3 positions shown; findings below may reference images not displayed]

FINDINGS: Chronic appearing L1 superior endplate compression fracture with
mild anterior wedging and 40% loss of height anteriorly. Slight
kyphosis at this level without retropulsion of the L1 vertebra.

No other compression fracture or significant disc space narrowing
noted.

Trace vascular calcifications.
IMPRESSION: Chronic appearing L1 compression fracture as detailed above.

## 2018-10-10 ENCOUNTER — Other Ambulatory Visit: Payer: Self-pay | Admitting: Family Medicine

## 2018-10-10 DIAGNOSIS — F419 Anxiety disorder, unspecified: Secondary | ICD-10-CM

## 2018-10-28 ENCOUNTER — Encounter: Payer: Self-pay | Admitting: Family Medicine

## 2018-11-05 ENCOUNTER — Encounter: Payer: Self-pay | Admitting: Family Medicine

## 2018-11-05 ENCOUNTER — Ambulatory Visit (INDEPENDENT_AMBULATORY_CARE_PROVIDER_SITE_OTHER): Payer: BC Managed Care – PPO | Admitting: Family Medicine

## 2018-11-05 ENCOUNTER — Other Ambulatory Visit: Payer: Self-pay

## 2018-11-05 VITALS — BP 132/64 | HR 86 | Temp 98.6°F | Ht 69.0 in | Wt 206.0 lb

## 2018-11-05 DIAGNOSIS — R5383 Other fatigue: Secondary | ICD-10-CM | POA: Diagnosis not present

## 2018-11-05 DIAGNOSIS — E559 Vitamin D deficiency, unspecified: Secondary | ICD-10-CM | POA: Diagnosis not present

## 2018-11-05 DIAGNOSIS — E890 Postprocedural hypothyroidism: Secondary | ICD-10-CM | POA: Diagnosis not present

## 2018-11-05 DIAGNOSIS — Z0000 Encounter for general adult medical examination without abnormal findings: Secondary | ICD-10-CM | POA: Diagnosis not present

## 2018-11-05 DIAGNOSIS — Z23 Encounter for immunization: Secondary | ICD-10-CM | POA: Diagnosis not present

## 2018-11-05 DIAGNOSIS — Z13220 Encounter for screening for lipoid disorders: Secondary | ICD-10-CM | POA: Diagnosis not present

## 2018-11-05 DIAGNOSIS — R635 Abnormal weight gain: Secondary | ICD-10-CM | POA: Diagnosis not present

## 2018-11-05 DIAGNOSIS — Z8781 Personal history of (healed) traumatic fracture: Secondary | ICD-10-CM

## 2018-11-05 NOTE — Progress Notes (Signed)
Subjective:    Sara Hull is a 53 y.o. female and is here for a comprehensive physical exam.  Health Maintenance Due  Topic Date Due  . MAMMOGRAM  10/08/2018  . PAP SMEAR-Modifier  09/19/2018     Current Outpatient Medications:  .  Fluocinolone Acetonide Body 0.01 % OIL, Apply 1 application topically at bedtime., Disp: 1 Bottle, Rfl: 0 .  fluticasone (FLONASE) 50 MCG/ACT nasal spray, 2 SPRAYS BOTH NOSTRILS DAILY, Disp: 16 g, Rfl: 4 .  JUNEL 1/20 1-20 MG-MCG tablet, Take 1 tablet by mouth daily. , Disp: , Rfl:  .  levothyroxine (SYNTHROID) 200 MCG tablet, TAKE ONE TABLET BY MOUTH DAILY BEFORE BREAKFAST, Disp: 90 tablet, Rfl: 2 .  Loratadine (CLARITIN) 10 MG CAPS, Claritin, Disp: , Rfl:  .  LOTEMAX 0.5 % ophthalmic suspension, , Disp: , Rfl:  .  metroNIDAZOLE (METROGEL) 0.75 % gel, Apply 1 application topically as needed. , Disp: , Rfl:  .  Naproxen (NAPROSYN PO), naproxen, Disp: , Rfl:  .  sertraline (ZOLOFT) 100 MG tablet, TAKE 1 TABLET BY MOUTH EVERY DAY, Disp: 90 tablet, Rfl: 0  PMHx, SurgHx, SocialHx, Medications, and Allergies were reviewed in the Visit Navigator and updated as appropriate.   Past Medical History:  Diagnosis Date  . Allergic rhinitis   . Dysthymia   . Hypothyroidism   . Rosacea      Past Surgical History:  Procedure Laterality Date  . HYSTEROSCOPY  04/2015   WITH ABLATION  . HYSTEROSCOPY W/D&C N/A 09/24/2014   Procedure: DILATATION AND CURETTAGE /HYSTEROSCOPY;  Surgeon: Sara Charles, MD;  Location: [facility];  Service: Gynecology;  Laterality: N/A;  . WISDOM TOOTH EXTRACTION       Family History  Problem Relation Age of Onset  . Other Mother        nectrotizing of the colon-had ileostomy that has been reversed  . Bipolar disorder Mother   . Hyperlipidemia Father   . Colon polyps Father   . Cancer Father        bile duct cancer  . Mental illness Brother        undefined, unable to live independently  . Colon cancer Neg Hx   . Esophageal  cancer Neg Hx   . Rectal cancer Neg Hx   . Stomach cancer Neg Hx     Social History   Tobacco Use  . Smoking status: Never Smoker  . Smokeless tobacco: Never Used  Substance Use Topics  . Alcohol use: Yes    Comment: occ  . Drug use: No    Review of Systems:   Pertinent items are noted in the HPI. Otherwise, ROS is negative.  Objective:   BP 132/64   Pulse 86   Temp 98.6 F (37 C) (Oral)   Ht 5\' 9"  (1.753 m)   Wt 206 lb (93.4 kg)   SpO2 96%   BMI 30.42 kg/m   General appearance: alert, cooperative and appears stated age. Head: normocephalic, without obvious abnormality, atraumatic. Neck: no adenopathy, supple, symmetrical, trachea midline; thyroid not enlarged, symmetric, no tenderness/mass/nodules. Lungs: clear to auscultation bilaterally. Heart: regular rate and rhythm Abdomen: soft, non-tender; no masses,  no organomegaly. Extremities: extremities normal, atraumatic, no cyanosis or edema. Skin: skin color, texture, turgor normal, no rashes or lesions. Lymph: cervical, supraclavicular, and axillary nodes normal; no abnormal inguinal nodes palpated. Neurologic: grossly normal.  Assessment/Plan:   Sara Hull was seen today for annual exam.  Diagnoses and all orders for this visit:  Routine physical examination  Hypothyroidism, postradioiodine therapy -     TSH -     T4, free  Vitamin D deficiency -     VITAMIN D 25 Hydroxy (Vit-D Deficiency, Fractures)  Weight gain  Need for immunization against influenza -     Flu Vaccine QUAD 36+ mos IM  Fatigue, unspecified type -     CBC with Differential/Platelet -     Comprehensive metabolic panel -     TSH -     Vitamin B12 -     Insulin, Free (Bioactive)  Screening for lipid disorders -     Lipid panel  History of compression fracture of spine -     DG Bone Density; Future    Patient Counseling: [x]    Nutrition: Stressed importance of moderation in sodium/caffeine  intake, saturated fat and cholesterol, caloric balance, sufficient intake of fresh fruits, vegetables, fiber, calcium, iron, and 1 mg of folate supplement per day (for females capable of pregnancy).  [x]    Stressed the importance of regular exercise.   [x]    Substance Abuse: Discussed cessation/primary prevention of tobacco, alcohol, or other drug use; driving or other dangerous activities under the influence; availability of treatment for abuse.   [x]    Injury prevention: Discussed safety belts, safety helmets, smoke detector, smoking near bedding or upholstery.   [x]    Sexuality: Discussed sexually transmitted diseases, partner selection, use of condoms, avoidance of unintended pregnancy  and contraceptive alternatives.  [x]    Dental health: Discussed importance of regular tooth brushing, flossing, and dental visits.  [x]    Health maintenance and immunizations reviewed. Please refer to Health maintenance section.   , DO [facility]

## 2018-11-05 NOTE — Patient Instructions (Addendum)
Dr. Gertie Exon, GYN is my favorite. Read about [place] Thyroid. Hold all alcohol. Drink more water.  Influenza (Flu) Vaccine (Inactivated or Recombinant): What You Need to Know 1. Why get vaccinated? Influenza vaccine can prevent influenza (flu). Flu is a contagious disease that spreads around the [place] every year, usually between October and May. Anyone can get the flu, but it is more dangerous for some people. Infants and young children, people 64 years of age and older, pregnant women, and people with certain health conditions or a weakened immune system are at greatest risk of flu complications. Pneumonia, bronchitis, sinus infections and ear infections are examples of flu-related complications. If you have a medical condition, such as heart disease, cancer or diabetes, flu can make it worse. Flu can cause fever and chills, sore throat, muscle aches, fatigue, cough, headache, and runny or stuffy nose. Some people may have vomiting and diarrhea, though this is more common in children than adults. Each year thousands of people in the [place] die from flu, and many more are hospitalized. Flu vaccine prevents millions of illnesses and flu-related visits to the doctor each year. 2. Influenza vaccine CDC recommends everyone 37 months of age and older get vaccinated every flu season. Children 6 months through 49 years of age may need 2 doses during a single flu season. Everyone else needs only 1 dose each flu season. It takes about 2 weeks for protection to develop after vaccination. There are many flu viruses, and they are always changing. Each year a new flu vaccine is made to protect against three or four viruses that are likely to cause disease in the upcoming flu season. Even when the vaccine doesn't exactly match these viruses, it may still provide some protection. Influenza vaccine does not cause flu. Influenza vaccine may be given at the same time as other vaccines. 3.  Talk with your health care provider Tell your vaccine provider if the person getting the vaccine:  Has had an allergic reaction after a previous dose of influenza vaccine, or has any severe, life-threatening allergies.  Has ever had Guillain-Barr Syndrome (also called GBS). In some cases, your health care provider may decide to postpone influenza vaccination to a future visit. People with minor illnesses, such as a cold, may be vaccinated. People who are moderately or severely ill should usually wait until they recover before getting influenza vaccine. Your health care provider can give you more information. 4. Risks of a vaccine reaction  Soreness, redness, and swelling where shot is given, fever, muscle aches, and headache can happen after influenza vaccine.  There may be a very small increased risk of Guillain-Barr Syndrome (GBS) after inactivated influenza vaccine (the flu shot). Young children who get the flu shot along with pneumococcal vaccine (PCV13), and/or DTaP vaccine at the same time might be slightly more likely to have a seizure caused by fever. Tell your health care provider if a child who is getting flu vaccine has ever had a seizure. People sometimes faint after medical procedures, including vaccination. Tell your provider if you feel dizzy or have vision changes or ringing in the ears. As with any medicine, there is a very remote chance of a vaccine causing a severe allergic reaction, other serious injury, or death. 5. What if there is a serious problem? An allergic reaction could occur after the vaccinated person leaves the clinic. If you see signs of a severe allergic reaction (hives, swelling of the face and throat, difficulty  breathing, a fast heartbeat, dizziness, or weakness), call 9-1-1 and get the person to the nearest hospital. For other signs that concern you, call your health care provider. Adverse reactions should be reported to the Vaccine Adverse Event Reporting  System (VAERS). Your health care provider will usually file this report, or you can do it yourself. Visit the VAERS website at www.vaers.SamedayNews.es or call (559)154-3356.VAERS is only for reporting reactions, and VAERS staff do not give medical advice. 6. The National Vaccine Injury Compensation Program The [employer] Vaccine Injury Compensation Program (VICP) is a federal program that was created to compensate people who may have been injured by certain vaccines. Visit the VICP website at GoldCloset.com.ee or call 775-838-3624 to learn about the program and about filing a claim. There is a time limit to file a claim for compensation. 7. How can I learn more?  Ask your healthcare provider.  Call your local or state health department.  Contact the Centers for Disease Control and Prevention (CDC): ? Call 3076933501 (1-800-CDC-INFO) or ? Visit CDC's https://gibson.com/ Vaccine Information Statement (Interim) Inactivated Influenza Vaccine (09/26/2017) This information is not intended to replace advice given to you by your health care provider. Make sure you discuss any questions you have with your health care provider. Document Released: 11/23/2005 Document Revised: 05/20/2018 Document Reviewed: 09/30/2017 Elsevier Patient Education  2020 [employer].

## 2018-11-06 LAB — COMPREHENSIVE METABOLIC PANEL
ALT: 21 U/L (ref 0–35)
AST: 23 U/L (ref 0–37)
Albumin: 4 g/dL (ref 3.5–5.2)
Alkaline Phosphatase: 81 U/L (ref 39–117)
BUN: 17 mg/dL (ref 6–23)
CO2: 30 mEq/L (ref 19–32)
Calcium: 9.9 mg/dL (ref 8.4–10.5)
Chloride: 99 mEq/L (ref 96–112)
Creatinine, Ser: 1.05 mg/dL (ref 0.40–1.20)
GFR: 54.53 mL/min — ABNORMAL LOW (ref >60.00)
Glucose, Bld: 99 mg/dL (ref 70–99)
Potassium: 4.5 mEq/L (ref 3.5–5.1)
Sodium: 135 mEq/L (ref 135–145)
Total Bilirubin: 0.5 mg/dL (ref 0.2–1.2)
Total Protein: 7.6 g/dL (ref 6.0–8.3)

## 2018-11-06 LAB — CBC WITH DIFFERENTIAL/PLATELET
Basophils Absolute: 0 10*3/uL (ref 0.0–0.1)
Basophils Relative: 1 % (ref 0.0–3.0)
Eosinophils Absolute: 0.1 10*3/uL (ref 0.0–0.7)
Eosinophils Relative: 3 % (ref 0.0–5.0)
HCT: 44.5 % (ref 36.0–46.0)
Hemoglobin: 15 g/dL (ref 12.0–15.0)
Lymphocytes Relative: 32.4 % (ref 12.0–46.0)
Lymphs Abs: 1.6 10*3/uL (ref 0.7–4.0)
MCHC: 33.8 g/dL (ref 30.0–36.0)
MCV: 90.7 fl (ref 78.0–100.0)
Monocytes Absolute: 0.5 10*3/uL (ref 0.1–1.0)
Monocytes Relative: 10.5 % (ref 3.0–12.0)
Neutro Abs: 2.6 10*3/uL (ref 1.4–7.7)
Neutrophils Relative %: 53.1 % (ref 43.0–77.0)
Platelets: 217 10*3/uL (ref 150.0–400.0)
RBC: 4.9 Mil/uL (ref 3.87–5.11)
RDW: 12.7 % (ref 11.5–15.5)
WBC: 4.9 10*3/uL (ref 4.0–10.5)

## 2018-11-06 LAB — LIPID PANEL
Cholesterol: 182 mg/dL (ref 0–200)
HDL: 52 mg/dL (ref >39.00)
LDL Cholesterol: 101 mg/dL — ABNORMAL HIGH (ref 0–99)
NonHDL: 130.19
Total CHOL/HDL Ratio: 4
Triglycerides: 147 mg/dL (ref 0.0–149.0)
VLDL: 29.4 mg/dL (ref 0.0–40.0)

## 2018-11-06 LAB — VITAMIN B12: Vitamin B-12: 278 pg/mL (ref 211–911)

## 2018-11-06 LAB — T4, FREE: Free T4: 1.24 ng/dL (ref 0.60–1.60)

## 2018-11-06 LAB — TSH: TSH: 0.51 u[IU]/mL (ref 0.35–4.50)

## 2018-11-06 LAB — VITAMIN D 25 HYDROXY (VIT D DEFICIENCY, FRACTURES): VITD: 14.9 ng/mL — ABNORMAL LOW (ref 30.00–100.00)

## 2018-11-11 ENCOUNTER — Encounter: Payer: Self-pay | Admitting: Family Medicine

## 2018-11-13 LAB — INSULIN, FREE (BIOACTIVE): Insulin, Free: 3.2 u[IU]/mL (ref 1.5–14.9)

## 2018-11-14 ENCOUNTER — Encounter: Payer: Self-pay | Admitting: Family Medicine

## 2018-11-17 ENCOUNTER — Other Ambulatory Visit: Payer: Self-pay

## 2018-11-17 ENCOUNTER — Ambulatory Visit
Admission: RE | Admit: 2018-11-17 | Discharge: 2018-11-17 | Disposition: A | Discharge status: Home / Self Care | Payer: BC Managed Care – PPO | Source: Ambulatory Visit | Attending: Family Medicine | Admitting: Family Medicine

## 2018-11-17 DIAGNOSIS — Z8781 Personal history of (healed) traumatic fracture: Secondary | ICD-10-CM

## 2018-11-17 LAB — HM MAMMOGRAPHY

## 2018-11-23 ENCOUNTER — Encounter: Payer: Self-pay | Admitting: Family Medicine

## 2018-11-24 NOTE — Telephone Encounter (Signed)
Pulled up please sign.

## 2018-11-25 MED ORDER — PHENTERMINE HCL 37.5 MG PO TABS: 37.5000 mg | ORAL_TABLET | Freq: Every day | ORAL | 2 refills | Status: DC

## 2018-11-25 MED ORDER — VITAMIN D (ERGOCALCIFEROL) 1.25 MG (50000 UNIT) PO CAPS: 50000.0000 [IU] | ORAL_CAPSULE | ORAL | 0 refills | Status: DC

## 2019-01-20 ENCOUNTER — Other Ambulatory Visit: Payer: Self-pay | Admitting: Family Medicine

## 2019-01-20 DIAGNOSIS — F419 Anxiety disorder, unspecified: Secondary | ICD-10-CM

## 2019-01-30 ENCOUNTER — Other Ambulatory Visit: Payer: BC Managed Care – PPO

## 2019-02-15 ENCOUNTER — Other Ambulatory Visit: Payer: Self-pay | Admitting: Family Medicine

## 2019-04-14 ENCOUNTER — Other Ambulatory Visit: Payer: Self-pay | Admitting: Family Medicine

## 2019-04-14 DIAGNOSIS — F419 Anxiety disorder, unspecified: Secondary | ICD-10-CM

## 2019-07-12 ENCOUNTER — Other Ambulatory Visit: Payer: Self-pay | Admitting: Family Medicine

## 2019-07-12 DIAGNOSIS — F419 Anxiety disorder, unspecified: Secondary | ICD-10-CM

## 2019-07-27 ENCOUNTER — Encounter: Payer: Self-pay | Admitting: Obstetrics and Gynecology

## 2019-08-21 ENCOUNTER — Other Ambulatory Visit: Payer: Self-pay | Admitting: Family Medicine

## 2019-08-21 DIAGNOSIS — E890 Postprocedural hypothyroidism: Secondary | ICD-10-CM

## 2019-08-24 NOTE — Telephone Encounter (Signed)
Please review

## 2019-08-30 ENCOUNTER — Other Ambulatory Visit: Payer: Self-pay | Admitting: Family Medicine

## 2019-08-30 DIAGNOSIS — F419 Anxiety disorder, unspecified: Secondary | ICD-10-CM

## 2019-09-01 ENCOUNTER — Other Ambulatory Visit: Payer: Self-pay | Admitting: Family Medicine

## 2019-09-01 DIAGNOSIS — F419 Anxiety disorder, unspecified: Secondary | ICD-10-CM

## 2019-09-04 ENCOUNTER — Telehealth (INDEPENDENT_AMBULATORY_CARE_PROVIDER_SITE_OTHER): Payer: BC Managed Care – PPO | Admitting: Family Medicine

## 2019-09-04 DIAGNOSIS — F419 Anxiety disorder, unspecified: Secondary | ICD-10-CM

## 2019-09-04 DIAGNOSIS — F3341 Major depressive disorder, recurrent, in partial remission: Secondary | ICD-10-CM | POA: Diagnosis not present

## 2019-09-04 DIAGNOSIS — E890 Postprocedural hypothyroidism: Secondary | ICD-10-CM

## 2019-09-04 MED ORDER — SERTRALINE HCL 100 MG PO TABS: 100.0000 mg | ORAL_TABLET | Freq: Every day | ORAL | 0 refills | Status: DC

## 2019-09-04 NOTE — Assessment & Plan Note (Signed)
Stable.  Continue Synthroid 200 mcg daily.  Does not need refill today.

## 2019-09-04 NOTE — Assessment & Plan Note (Signed)
Stable.  Will refill Zoloft until she can establish care with new PCP.

## 2019-09-04 NOTE — Assessment & Plan Note (Signed)
Stable.  Continue Zoloft 100 mg daily. 

## 2019-09-04 NOTE — Progress Notes (Signed)
   Sara Hull is a 54 y.o. female who presents today for a telephone visit.  Assessment/Plan:  Chronic Problems Addressed Today: Dysthymia, on Zoloft Stable.  Will refill Zoloft until she can establish care with new PCP.  Hypothyroidism, postradioiodine therapy Stable.  Continue Synthroid 200 mcg daily.  Does not need refill today.  Anxiety Stable.  Continue Zoloft 100 mg daily.     Subjective:  HPI:  Patient is transferring care to new PCP.  She will be following up with him in a couple of months.  Previous PCP no local works with his office however she needs a refill on her prescription for sertraline 100mg  daily.  She has been on this for several years has done well with it.  She was also prescribed Synthroid 200 mcg daily.  She feels like she is doing well at this.  Does not need a refill on this today.       Objective/Observations   NAD  Telephone Visit   I connected with Sara Hull on 09/04/19 at  3:40 PM EDT via telephone and verified that I am speaking with the correct person using two identifiers. I discussed the limitations of evaluation and management by telemedicine and the availability of in person appointments. The patient expressed understanding and agreed to proceed.   Patient location: Home Provider location: [facility] 09/06/19 Persons participating in the virtual visit: Myself and Patient      [employer]. Katina Degree, MD 09/04/2019 3:55 PM

## 2019-09-07 ENCOUNTER — Other Ambulatory Visit: Payer: Self-pay

## 2019-10-29 ENCOUNTER — Encounter: Payer: Self-pay | Admitting: Obstetrics and Gynecology

## 2019-10-29 ENCOUNTER — Other Ambulatory Visit (HOSPITAL_COMMUNITY)
Admission: RE | Admit: 2019-10-29 | Discharge: 2019-10-29 | Disposition: A | Discharge status: Home / Self Care | Payer: BC Managed Care – PPO | Source: Ambulatory Visit | Attending: Obstetrics and Gynecology | Admitting: Obstetrics and Gynecology

## 2019-10-29 ENCOUNTER — Other Ambulatory Visit: Payer: Self-pay

## 2019-10-29 ENCOUNTER — Ambulatory Visit: Payer: BC Managed Care – PPO | Admitting: Obstetrics and Gynecology

## 2019-10-29 VITALS — BP 132/80 | HR 72 | Ht 69.0 in | Wt 179.2 lb

## 2019-10-29 DIAGNOSIS — Z124 Encounter for screening for malignant neoplasm of cervix: Secondary | ICD-10-CM

## 2019-10-29 DIAGNOSIS — N840 Polyp of corpus uteri: Secondary | ICD-10-CM

## 2019-10-29 DIAGNOSIS — K5900 Constipation, unspecified: Secondary | ICD-10-CM

## 2019-10-29 DIAGNOSIS — Z01419 Encounter for gynecological examination (general) (routine) without abnormal findings: Secondary | ICD-10-CM

## 2019-10-29 DIAGNOSIS — R232 Flushing: Secondary | ICD-10-CM

## 2019-10-29 DIAGNOSIS — R6882 Decreased libido: Secondary | ICD-10-CM

## 2019-10-29 DIAGNOSIS — E890 Postprocedural hypothyroidism: Secondary | ICD-10-CM

## 2019-10-29 DIAGNOSIS — Z0000 Encounter for general adult medical examination without abnormal findings: Secondary | ICD-10-CM | POA: Diagnosis not present

## 2019-10-29 DIAGNOSIS — R61 Generalized hyperhidrosis: Secondary | ICD-10-CM

## 2019-10-29 DIAGNOSIS — E559 Vitamin D deficiency, unspecified: Secondary | ICD-10-CM

## 2019-10-29 NOTE — Patient Instructions (Addendum)
EXERCISE AND DIET:  We recommended that you start or continue a regular exercise program for good health. Regular exercise means any activity that makes your heart beat faster and makes you sweat.  We recommend exercising at least 30 minutes per day at least 3 days a week, preferably 4 or 5.  We also recommend a diet low in fat and sugar.  Inactivity, poor dietary choices and obesity can cause diabetes, heart attack, stroke, and kidney damage, among others.    ALCOHOL AND SMOKING:  Women should limit their alcohol intake to no more than 7 drinks/beers/glasses of wine (combined, not each!) per week. Moderation of alcohol intake to this level decreases your risk of breast cancer and liver damage. And of course, no recreational drugs are part of a healthy lifestyle.  And absolutely no smoking or even second hand smoke. Most people know smoking can cause heart and lung diseases, but did you know it also contributes to weakening of your bones? Aging of your skin?  Yellowing of your teeth and nails?  CALCIUM AND VITAMIN D:  Adequate intake of calcium and Vitamin D are recommended.  The recommendations for exact amounts of these supplements seem to change often, but generally speaking 1,200 mg of calcium (between diet and supplement) and 800 units of Vitamin D per day seems prudent. Certain women may benefit from higher intake of Vitamin D.  If you are among these women, your doctor will have told you during your visit.    PAP SMEARS:  Pap smears, to check for cervical cancer or precancers,  have traditionally been done yearly, although recent scientific advances have shown that most women can have pap smears less often.  However, every woman still should have a physical exam from her gynecologist every year. It will include a breast check, inspection of the vulva and vagina to check for abnormal growths or skin changes, a visual exam of the cervix, and then an exam to evaluate the size and shape of the uterus and  ovaries.  And after 55 years of age, a rectal exam is indicated to check for rectal cancers. We will also provide age appropriate advice regarding health maintenance, like when you should have certain vaccines, screening for sexually transmitted diseases, bone density testing, colonoscopy, mammograms, etc.   MAMMOGRAMS:  All women over 40 years old should have a yearly mammogram. Many facilities now offer a "3D" mammogram, which may cost around $50 extra out of pocket. If possible,  we recommend you accept the option to have the 3D mammogram performed.  It both reduces the number of women who will be called back for extra views which then turn out to be normal, and it is better than the routine mammogram at detecting truly abnormal areas.    COLON CANCER SCREENING: Now recommend starting at age 45. At this time colonoscopy is not covered for routine screening until 50. There are take home tests that can be done between 45-49.   COLONOSCOPY:  Colonoscopy to screen for colon cancer is recommended for all women at age 50.  We know, you hate the idea of the prep.  We agree, BUT, having colon cancer and not knowing it is worse!!  Colon cancer so often starts as a polyp that can be seen and removed at colonscopy, which can quite literally save your life!  And if your first colonoscopy is normal and you have no family history of colon cancer, most women don't have to have it again for   10 years.  Once every ten years, you can do something that may end up saving your life, right?  We will be happy to help you get it scheduled when you are ready.  Be sure to check your insurance coverage so you understand how much it will cost.  It may be covered as a preventative service at no cost, but you should check your particular policy.      Breast Self-Awareness Breast self-awareness means being familiar with how your breasts look and feel. It involves checking your breasts regularly and reporting any changes to your  health care provider. Practicing breast self-awareness is important. A change in your breasts can be a sign of a serious medical problem. Being familiar with how your breasts look and feel allows you to find any problems early, when treatment is more likely to be successful. All women should practice breast self-awareness, including women who have had breast implants. How to do a breast self-exam One way to learn what is normal for your breasts and whether your breasts are changing is to do a breast self-exam. To do a breast self-exam: Look for Changes  1. Remove all the clothing above your waist. 2. Stand in front of a mirror in a room with good lighting. 3. Put your hands on your hips. 4. Push your hands firmly downward. 5. Compare your breasts in the mirror. Look for differences between them (asymmetry), such as: ? Differences in shape. ? Differences in size. ? Puckers, dips, and bumps in one breast and not the other. 6. Look at each breast for changes in your skin, such as: ? Redness. ? Scaly areas. 7. Look for changes in your nipples, such as: ? Discharge. ? Bleeding. ? Dimpling. ? Redness. ? A change in position. Feel for Changes Carefully feel your breasts for lumps and changes. It is best to do this while lying on your back on the floor and again while sitting or standing in the shower or tub with soapy water on your skin. Feel each breast in the following way:  Place the arm on the side of the breast you are examining above your head.  Feel your breast with the other hand.  Start in the nipple area and make  inch (2 cm) overlapping circles to feel your breast. Use the pads of your three middle fingers to do this. Apply light pressure, then medium pressure, then firm pressure. The light pressure will allow you to feel the tissue closest to the skin. The medium pressure will allow you to feel the tissue that is a little deeper. The firm pressure will allow you to feel the tissue  close to the ribs.  Continue the overlapping circles, moving downward over the breast until you feel your ribs below your breast.  Move one finger-width toward the center of the body. Continue to use the  inch (2 cm) overlapping circles to feel your breast as you move slowly up toward your collarbone.  Continue the up and down exam using all three pressures until you reach your armpit.  Write Down What You Find  Write down what is normal for each breast and any changes that you find. Keep a written record with breast changes or normal findings for each breast. By writing this information down, you do not need to depend only on memory for size, tenderness, or location. Write down where you are in your menstrual cycle, if you are still menstruating. If you are having trouble noticing differences   in your breasts, do not get discouraged. With time you will become more familiar with the variations in your breasts and more comfortable with the exam. How often should I examine my breasts? Examine your breasts every month. If you are breastfeeding, the best time to examine your breasts is after a feeding or after using a breast pump. If you menstruate, the best time to examine your breasts is 5-7 days after your period is over. During your period, your breasts are lumpier, and it may be more difficult to notice changes. When should I see my health care provider? See your health care provider if you notice:  A change in shape or size of your breasts or nipples.  A change in the skin of your breast or nipples, such as a reddened or scaly area.  Unusual discharge from your nipples.  A lump or thick area that was not there before.  Pain in your breasts.  Anything that concerns you. About Constipation  Constipation Overview Constipation is the most common gastrointestinal complaint -- about 4 million Americans experience constipation and make 2.5 million physician visits a year to get help for the  problem.  Constipation can occur when the colon absorbs too much water, the colon's muscle contraction is slow or sluggish, and/or there is delayed transit time through the colon.  The result is stool that is hard and dry.  Indicators of constipation include straining during bowel movements greater than 25% of the time, having fewer than three bowel movements per week, and/or the feeling of incomplete evacuation.  There are established guidelines ([place] II ) for defining constipation. A person needs to have two or more of the following symptoms for at least 12 weeks (not necessarily consecutive) in the preceding 12 months: . Straining in  greater than 25% of bowel movements . Lumpy or hard stools in greater than 25% of bowel movements . Sensation of incomplete emptying in greater than 25% of bowel movements . Sensation of anorectal obstruction/blockade in greater than 25% of bowel movements . Manual maneuvers to help empty greater than 25% of bowel movements (e.g., digital evacuation, support of the pelvic floor)  . Less than  3 bowel movements/week . Loose stools are not present, and criteria for irritable bowel syndrome are insufficient  Common Causes of Constipation . Lack of fiber in your diet . Lack of physical activity . Medications, including iron and calcium supplements  . Dairy intake . Dehydration . Abuse of laxatives  Travel  Irritable Bowel Syndrome  Pregnancy  Luteal phase of menstruation (after ovulation and before menses)  Colorectal problems  Intestinal Dysfunction  Treating Constipation  There are several ways of treating constipation, including changes to diet and exercise, use of laxatives, adjustments to the pelvic floor, and scheduled toileting.  These treatments include: . increasing fiber and fluids in the diet  . increasing physical activity . learning muscle coordination   learning proper toileting techniques and toileting modifications   designing and  sticking  to a toileting schedule     2007, [employer] Doc.22   Perimenopause  Perimenopause is the normal time of life before and after menstrual periods stop completely (menopause). Perimenopause can begin 2-8 years before menopause, and it usually lasts for 1 year after menopause. During perimenopause, the ovaries may or may not produce an egg. What are the causes? This condition is caused by a natural change in hormone levels that happens as you get older. What increases the risk? This condition is  more likely to start at an earlier age if you have certain medical conditions or treatments, including:  A tumor of the pituitary gland in the brain.  A disease that affects the ovaries and hormone production.  Radiation treatment for cancer.  Certain cancer treatments, such as chemotherapy or hormone (anti-estrogen) therapy.  Heavy smoking and excessive alcohol use.  Family history of early menopause. What are the signs or symptoms? Perimenopausal changes affect each woman differently. Symptoms of this condition may include:  Hot flashes.  Night sweats.  Irregular menstrual periods.  Decreased sex drive.  Vaginal dryness.  Headaches.  Mood swings.  Depression.  Memory problems or trouble concentrating.  Irritability.  Tiredness.  Weight gain.  Anxiety.  Trouble getting pregnant. How is this diagnosed? This condition is diagnosed based on your medical history, a physical exam, your age, your menstrual history, and your symptoms. Hormone tests may also be done. How is this treated? In some cases, no treatment is needed. You and your health care provider should make a decision together about whether treatment is necessary. Treatment will be based on your individual condition and preferences. Various treatments are available, such as:  Menopausal hormone therapy (MHT).  Medicines to treat specific symptoms.  Acupuncture.  Vitamin or herbal  supplements. Before starting treatment, make sure to let your health care provider know if you have a personal or family history of:  Heart disease.  Breast cancer.  Blood clots.  Diabetes.  Osteoporosis. Follow these instructions at home: Lifestyle  Do not use any products that contain nicotine or tobacco, such as cigarettes and e-cigarettes. If you need help quitting, ask your health care provider.  Eat a balanced diet that includes fresh fruits and vegetables, whole grains, soybeans, eggs, lean meat, and low-fat dairy.  Get at least 30 minutes of physical activity on 5 or more days each week.  Avoid alcoholic and caffeinated beverages, as well as spicy foods. This may help prevent hot flashes.  Get 7-8 hours of sleep each night.  Dress in layers that can be removed to help you manage hot flashes.  Find ways to manage stress, such as deep breathing, meditation, or journaling. General instructions  Keep track of your menstrual periods, including: ? When they occur. ? How heavy they are and how long they last. ? How much time passes between periods.  Keep track of your symptoms, noting when they start, how often you have them, and how long they last.  Take over-the-counter and prescription medicines only as told by your health care provider.  Take vitamin supplements only as told by your health care provider. These may include calcium, vitamin E, and vitamin D.  Use vaginal lubricants or moisturizers to help with vaginal dryness and improve comfort during sex.  Talk with your health care provider before starting any herbal supplements.  Keep all follow-up visits as told by your health care provider. This is important. This includes any group therapy or counseling. Contact a health care provider if:  You have heavy vaginal bleeding or pass blood clots.  Your period lasts more than 2 days longer than normal.  Your periods are recurring sooner than 21 days.  You  bleed after having sex. Get help right away if:  You have chest pain, trouble breathing, or trouble talking.  You have severe depression.  You have pain when you urinate.  You have severe headaches.  You have vision problems. Summary  Perimenopause is the time when a woman's body begins  to move into menopause. This may happen naturally or as a result of other health problems or medical treatments.  Perimenopause can begin 2-8 years before menopause, and it usually lasts for 1 year after menopause.  Perimenopausal symptoms can be managed through medicines, lifestyle changes, and complementary therapies such as acupuncture. This information is not intended to replace advice given to you by your health care provider. Make sure you discuss any questions you have with your health care provider. Document Revised: 01/11/2017 Document Reviewed: 03/06/2016 Elsevier Patient Education  2020 [employer].

## 2019-10-29 NOTE — Progress Notes (Signed)
54 y.o. G0P0000 Married White or Caucasian Not Hispanic or Latino female here for annual exam. She wants to know if she needs to know if she needs to continue to take birth control pills. She had stopped taking them as of Sunday. She does have night sweats. She has a concern of how would she know if anything is wrong like cancer since she wont bleed due to the ablation. She is having some pelvic pressure.     She was on Junel. H/O endometrial ablation in 3/17. She spotted a few times after the ablation, but not for years.  H/O removal of a large endometrial polyp in 8/16 then again in 3/17, did the ablation at that time. She was on OCP's when she got the polyps. She was supposed to restart her pills 2.5 weeks ago.  Vasomotor symptoms are mostly tolerable, occasional she soaks the sheets. She has some vaginal dryness.   Great marriage, married x 5 years. Low libido for both of them.   She has been on weight watcher and has lost 25 lbs in the last 6 months.   She has been very constipated the last 1-2 weeks. Having a BM daily   Husband has been seeing Dr Earlene Plater and has lost 50 lbs.   No LMP recorded. Patient has had an ablation.          Sexually active: Yes.    The current method of family planning is oral progesterone-only contraceptive.    Exercising: Yes.    Walking  Smoker:  no  Health Maintenance: Pap:  09/19/15 WNL  History of abnormal Pap:  no MMG:  11/2018 normal  BMD:   11/17/2018  Colonoscopy: 10/10/2015 f/u 10 years TDaP:  01/10/15 Gardasil: none    reports that she has never smoked. She has never used smokeless tobacco. She reports current alcohol use. She reports that she does not use drugs.  Past Medical History:  Diagnosis Date  . Allergic rhinitis   . Dysthymia   . Hypothyroidism   . Rosacea     Past Surgical History:  Procedure Laterality Date  . HYSTEROSCOPY  04/2015   WITH ABLATION  . HYSTEROSCOPY WITH D & C N/A 09/24/2014   Procedure: DILATATION AND  CURETTAGE /HYSTEROSCOPY;  Surgeon: Marlow Baars, MD;  Location: [facility];  Service: Gynecology;  Laterality: N/A;  . WISDOM TOOTH EXTRACTION      Current Outpatient Medications  Medication Sig Dispense Refill  . fluticasone (FLONASE) 50 MCG/ACT nasal spray 2 SPRAYS BOTH NOSTRILS DAILY 16 g 4  . levothyroxine (SYNTHROID) 200 MCG tablet TAKE ONE TABLET BY MOUTH DAILY BEFORE BREAKFAST 90 tablet 2  . Loratadine (CLARITIN) 10 MG CAPS Claritin    . metroNIDAZOLE (METROGEL) 0.75 % gel Apply 1 application topically as needed.     . sertraline (ZOLOFT) 100 MG tablet Take 1 tablet (100 mg total) by mouth daily. 90 tablet 0   No current facility-administered medications for this visit.    Family History  Problem Relation Age of Onset  . Other Mother        nectrotizing of the colon-had ileostomy that has been reversed  . Bipolar disorder Mother   . Hyperlipidemia Father   . Colon polyps Father   . Cancer Father        bile duct cancer  . Mental illness Brother        undefined, unable to live independently  . Colon cancer Neg Hx   . Esophageal cancer Neg Hx   .  Rectal cancer Neg Hx   . Stomach cancer Neg Hx     Review of Systems  Genitourinary:       Pelvic pressure  All other systems reviewed and are negative.   Exam:   BP 132/80   Pulse 72   Ht 5\' 9"  (1.753 m)   Wt 179 lb 3.2 oz (81.3 kg)   SpO2 99%   BMI 26.46 kg/m   Weight change: @WEIGHTCHANGE @ Height:   Height: 5\' 9"  (175.3 cm)  Ht Readings from Last 3 Encounters:  10/29/19 5\' 9"  (1.753 m)  11/05/18 5\' 9"  (1.753 m)  04/15/18 5\' 9"  (1.753 m)    General appearance: alert, cooperative and appears stated age Head: Normocephalic, without obvious abnormality, atraumatic Neck: no adenopathy, supple, symmetrical, trachea midline and thyroid normal to inspection and palpation Lungs: clear to auscultation bilaterally Cardiovascular: regular rate and rhythm Breasts: normal appearance, no masses or tenderness Abdomen: soft,  non-tender; non distended,  no masses,  no organomegaly Extremities: extremities normal, atraumatic, no cyanosis or edema Skin: Skin color, texture, turgor normal. No rashes or lesions Lymph nodes: Cervical, supraclavicular, and axillary nodes normal. No abnormal inguinal nodes palpated Neurologic: Grossly normal   Pelvic: External genitalia:  no lesions              Urethra:  normal appearing urethra with no masses, tenderness or lesions              Bartholins and Skenes: normal                 Vagina: normal appearing vagina with normal color and discharge, no lesions. Not atrophic              Cervix: no lesions, friable with pap               Bimanual Exam:  Uterus:  normal size, contour, position, consistency, mobility, non-tender and anteverted              Adnexa: no mass, fullness, tenderness               Rectovaginal: Confirms               Anus:  normal sphincter tone, no lesions  10/31/19 chaperoned for the exam.  A:  Well Woman with normal exam  Vit d def  Vasomotor symptoms  Low libido, given information. Declines testosterone levels  Just came off of OCP's, having tolerable vasomotor symptoms  H/O endometrial ablation  P:   Pap with hpv  Screening labs, TSH, FSH, vit d  Mammogram next month  Colonoscopy UTD  Discussed breast self exam  Discussed calcium and vit D intake  After [facility] returns, will likely give her at least one course of provera in 2 months.

## 2019-10-30 ENCOUNTER — Other Ambulatory Visit: Payer: Self-pay | Admitting: Family Medicine

## 2019-10-30 ENCOUNTER — Telehealth: Payer: Self-pay | Admitting: *Deleted

## 2019-10-30 DIAGNOSIS — F419 Anxiety disorder, unspecified: Secondary | ICD-10-CM

## 2019-10-30 LAB — COMPREHENSIVE METABOLIC PANEL
ALT: 26 IU/L (ref 0–32)
AST: 25 IU/L (ref 0–40)
Albumin/Globulin Ratio: 1.2 (ref 1.2–2.2)
Albumin: 4.1 g/dL (ref 3.8–4.9)
Alkaline Phosphatase: 87 IU/L (ref 44–121)
BUN/Creatinine Ratio: 25 — ABNORMAL HIGH (ref 9–23)
BUN: 18 mg/dL (ref 6–24)
Bilirubin Total: 0.4 mg/dL (ref 0.0–1.2)
CO2: 24 mmol/L (ref 20–29)
Calcium: 9.2 mg/dL (ref 8.7–10.2)
Chloride: 100 mmol/L (ref 96–106)
Creatinine, Ser: 0.71 mg/dL (ref 0.57–1.00)
GFR calc Af Amer: 111 mL/min/{1.73_m2} (ref >59)
GFR calc non Af Amer: 96 mL/min/{1.73_m2} (ref >59)
Globulin, Total: 3.5 g/dL (ref 1.5–4.5)
Glucose: 87 mg/dL (ref 65–99)
Potassium: 3.9 mmol/L (ref 3.5–5.2)
Sodium: 136 mmol/L (ref 134–144)
Total Protein: 7.6 g/dL (ref 6.0–8.5)

## 2019-10-30 LAB — CBC
Hematocrit: 47.7 % — ABNORMAL HIGH (ref 34.0–46.6)
Hemoglobin: 15.5 g/dL (ref 11.1–15.9)
MCH: 30.6 pg (ref 26.6–33.0)
MCHC: 32.5 g/dL (ref 31.5–35.7)
MCV: 94 fL (ref 79–97)
Platelets: 211 10*3/uL (ref 150–450)
RBC: 5.07 x10E6/uL (ref 3.77–5.28)
RDW: 11.6 % — ABNORMAL LOW (ref 11.7–15.4)
WBC: 4.9 10*3/uL (ref 3.4–10.8)

## 2019-10-30 LAB — CYTOLOGY - PAP
Comment: NEGATIVE
Diagnosis: NEGATIVE
High risk HPV: NEGATIVE

## 2019-10-30 LAB — FOLLICLE STIMULATING HORMONE: FSH: 5.3 m[IU]/mL

## 2019-10-30 LAB — LIPID PANEL
Chol/HDL Ratio: 3.5 ratio (ref 0.0–4.4)
Cholesterol, Total: 142 mg/dL (ref 100–199)
HDL: 41 mg/dL (ref >39)
LDL Chol Calc (NIH): 82 mg/dL (ref 0–99)
Triglycerides: 105 mg/dL (ref 0–149)
VLDL Cholesterol Cal: 19 mg/dL (ref 5–40)

## 2019-10-30 LAB — TSH: TSH: 0.392 u[IU]/mL — ABNORMAL LOW (ref 0.450–4.500)

## 2019-10-30 LAB — VITAMIN D 25 HYDROXY (VIT D DEFICIENCY, FRACTURES): Vit D, 25-Hydroxy: 31.6 ng/mL (ref 30.0–100.0)

## 2019-10-30 MED ORDER — NORETHINDRONE 0.35 MG PO TABS: 1.0000 | ORAL_TABLET | Freq: Every day | ORAL | 3 refills | Status: DC

## 2019-10-30 NOTE — Telephone Encounter (Signed)
Leda Min, RN  10/30/2019 11:40 AM EDT Back to Top    Left message to call Noreene Larsson, RN at [facility] [phone].

## 2019-10-30 NOTE — Telephone Encounter (Signed)
Spoke with pt. Pt given results and recommendations per Dr Oscar La. Pt agreeable to take Micronor. Pt states stopped Junel OCP x 2 weeks ago.  Rx Micronor # 90, 3RF sent to pharmacy on file. Pt verbalized understanding of how to take. Pt states not taking any vit D supplement. Pt advised to take 1000-2000 IU daily. Pt agreeable.  Advised will give update to Dr Oscar La and return call if any further recommendations. Pt agreeable.  PCP updated in Epic.  Routing to Dr Oscar La  Encounter closed.

## 2019-10-30 NOTE — Telephone Encounter (Signed)
-----   Message from Romualdo Bolk, MD sent at 10/30/2019 11:06 AM EDT ----- Please let the patient know that her TSH is a little over Suppressed, she should discuss this with her primary.  Her FSH is definitely still in the premenopausal range. I would recommend that she go on micronor. This is progesterone only contraception and will provide endometrial protection in addition to contraception. It is safe for her to be on this and I'm much more comfortable with this than continuing OCP's.  Her vit d is just in the normal range, I would recommend that she increase her current vit d intake by at 800 IU a day (long term)

## 2019-11-03 ENCOUNTER — Other Ambulatory Visit: Payer: Self-pay

## 2019-11-04 ENCOUNTER — Ambulatory Visit (INDEPENDENT_AMBULATORY_CARE_PROVIDER_SITE_OTHER): Payer: BC Managed Care – PPO | Admitting: Family Medicine

## 2019-11-04 ENCOUNTER — Encounter: Payer: Self-pay | Admitting: Family Medicine

## 2019-11-04 VITALS — BP 120/82 | HR 69 | Temp 97.9°F | Ht 69.5 in | Wt 181.6 lb

## 2019-11-04 DIAGNOSIS — Z0000 Encounter for general adult medical examination without abnormal findings: Secondary | ICD-10-CM | POA: Diagnosis not present

## 2019-11-04 DIAGNOSIS — Z23 Encounter for immunization: Secondary | ICD-10-CM

## 2019-11-04 DIAGNOSIS — E890 Postprocedural hypothyroidism: Secondary | ICD-10-CM | POA: Diagnosis not present

## 2019-11-04 DIAGNOSIS — E559 Vitamin D deficiency, unspecified: Secondary | ICD-10-CM | POA: Diagnosis not present

## 2019-11-04 DIAGNOSIS — F419 Anxiety disorder, unspecified: Secondary | ICD-10-CM

## 2019-11-04 DIAGNOSIS — F3341 Major depressive disorder, recurrent, in partial remission: Secondary | ICD-10-CM

## 2019-11-04 MED ORDER — SERTRALINE HCL 100 MG PO TABS: 100.0000 mg | ORAL_TABLET | Freq: Every day | ORAL | 3 refills | Status: DC

## 2019-11-04 MED ORDER — LEVOTHYROXINE SODIUM 200 MCG PO TABS: ORAL_TABLET | ORAL | 3 refills | Status: DC

## 2019-11-04 NOTE — Progress Notes (Signed)
Sara Hull is a 54 y.o. female  Chief Complaint  Patient presents with   Establish Care    TOC- CPE/labs, not fasting this am. no concerns    HPI: Sara Hull is a 54 y.o. female seen today to establish care with our office, previous PCP Dr. Helane Rima at [facility]. She follows with GYN Dr. Gertie Exon.  She had labs done with Dr. Oscar La last week including CBC, CMP, Vit D, TSH, lipid panel. Pt would like flu vaccine today.  Last PAP: 10/2019 Last mammo: UTD Last colonoscopy: 09/2015 - due in 09/2025 - Dr. Myrtie Neither w/ [facility] Last Dexa: 11/2018 - normal   Diet/Exercise: pt has lost 25lbs since 03/2019 with [employer], goal is to lose additional 15lbs Dental: UTD Vision: UTD  Med refills needed today? See orders  Last vitamin D Lab Results  Component Value Date   VD25OH 31.6 10/29/2019   Lab Results  Component Value Date   CHOL 142 10/29/2019   HDL 41 10/29/2019   LDLCALC 82 10/29/2019   TRIG 105 10/29/2019   CHOLHDL 3.5 10/29/2019   Lab Results  Component Value Date   WBC 4.9 10/29/2019   HGB 15.5 10/29/2019   HCT 47.7 (H) 10/29/2019   MCV 94 10/29/2019   PLT 211 10/29/2019   Lab Results  Component Value Date   BUN 18 10/29/2019   Lab Results  Component Value Date   CREATININE 0.71 10/29/2019   Lab Results  Component Value Date   NA 136 10/29/2019   K 3.9 10/29/2019   CL 100 10/29/2019   CO2 24 10/29/2019   Lab Results  Component Value Date   TSH 0.392 (L) 10/29/2019    Past Medical History:  Diagnosis Date   Allergic rhinitis    Dysthymia    Hypothyroidism    Rosacea     Past Surgical History:  Procedure Laterality Date   HYSTEROSCOPY  04/2015   WITH ABLATION   HYSTEROSCOPY WITH D & C N/A 09/24/2014   Procedure: DILATATION AND CURETTAGE /HYSTEROSCOPY;  Surgeon: Marlow Baars, MD;  Location: [facility];  Service: Gynecology;  Laterality: N/A;   WISDOM TOOTH EXTRACTION      Social History   Socioeconomic History    Marital status: Married    Spouse name: Not on file   Number of children: Not on file   Years of education: Not on file   Highest education level: Not on file  Occupational History   Occupation: SPEECH LANGUAGE PATHOLOGIST    Employer: [employer]  Tobacco Use   Smoking status: Never Smoker   Smokeless tobacco: Never Used  Vaping Use   Vaping Use: Never used  Substance and Sexual Activity   Alcohol use: Yes    Comment: occ   Drug use: No   Sexual activity: Yes    Birth control/protection: Pill  Other Topics Concern   Not on file  Social History Narrative   Not on file   Social Determinants of Health   Financial Resource Strain:    Difficulty of Paying Living Expenses: Not on file  Food Insecurity:    Worried About Programme researcher, broadcasting/film/video in the Last Year: Not on file   [employer] of Food in the Last Year: Not on file  Transportation Needs:    Lack of Transportation (Medical): Not on file   Lack of Transportation (Non-Medical): Not on file  Physical Activity:    Days of Exercise  per Week: Not on file   Minutes of Exercise per Session: Not on file  Stress:    Feeling of Stress : Not on file  Social Connections:    Frequency of Communication with Friends and Family: Not on file   Frequency of Social Gatherings with Friends and Family: Not on file   Attends Religious Services: Not on file   Active Member of Clubs or Organizations: Not on file   Attends Banker Meetings: Not on file   Marital Status: Not on file  Intimate Partner Violence:    Fear of Current or Ex-Partner: Not on file   Emotionally Abused: Not on file   Physically Abused: Not on file   Sexually Abused: Not on file    Family History  Problem Relation Age of Onset   Other Mother        nectrotizing of the colon-had ileostomy that has been reversed   Bipolar disorder Mother    Hyperlipidemia Father    Colon polyps Father    Cancer Father         bile duct cancer   Mental illness Brother        undefined, unable to live independently   Colon cancer Neg Hx    Esophageal cancer Neg Hx    Rectal cancer Neg Hx    Stomach cancer Neg Hx      Immunization History  Administered Date(s) Administered   Influenza Whole 10/27/2009   Influenza,inj,Quad PF,6+ Mos 11/24/2017, 11/05/2018   Influenza-Unspecified 11/25/2013, 11/13/2014, 12/09/2014, 11/15/2017   PFIZER SARS-COV-2 Vaccination 04/17/2019, 05/13/2019   Tdap 01/10/2015    Outpatient Encounter Medications as of 11/04/2019  Medication Sig Note   fluticasone (FLONASE) 50 MCG/ACT nasal spray 2 SPRAYS BOTH NOSTRILS DAILY    levothyroxine (SYNTHROID) 200 MCG tablet TAKE ONE TABLET BY MOUTH DAILY BEFORE BREAKFAST    Loratadine (CLARITIN) 10 MG CAPS Claritin    metroNIDAZOLE (METROGEL) 0.75 % gel Apply 1 application topically as needed.  04/15/2018: Pt uses prn   norethindrone (MICRONOR) 0.35 MG tablet Take 1 tablet (0.35 mg total) by mouth daily.    sertraline (ZOLOFT) 100 MG tablet Take 1 tablet (100 mg total) by mouth daily.    [DISCONTINUED] levothyroxine (SYNTHROID) 200 MCG tablet TAKE ONE TABLET BY MOUTH DAILY BEFORE BREAKFAST    [DISCONTINUED] sertraline (ZOLOFT) 100 MG tablet TAKE 1 TABLET BY MOUTH EVERY DAY    No facility-administered encounter medications on file as of 11/04/2019.     ROS: Gen: no fever, chills  Skin: no rash, itching ENT: no ear pain, ear drainage, nasal congestion, rhinorrhea, sinus pressure, sore throat Eyes: no blurry vision, double vision Resp: no cough, wheeze,SOB CV: no CP, palpitations, LE edema,  GI: no heartburn, n/v/d/c, abd pain GU: no dysuria, urgency, frequency, hematuria MSK: no joint pain, myalgias, back pain Neuro: no dizziness, headache, weakness, vertigo Psych: no depression, anxiety, insomnia   No Known Allergies  BP 120/82    Pulse 69    Temp 97.9 F (36.6 C) (Temporal)    Ht 5' 9.5" (1.765 m)    Wt 181  lb 9.6 oz (82.4 kg)    SpO2 98%    BMI 26.43 kg/m   Physical Exam Constitutional:      General: She is not in acute distress.    Appearance: She is well-developed.  HENT:     Head: Normocephalic and atraumatic.     Right Ear: Tympanic membrane and ear canal normal.  Left Ear: Tympanic membrane and ear canal normal.     Nose: Nose normal.  Eyes:     Conjunctiva/sclera: Conjunctivae normal.     Pupils: Pupils are equal, round, and reactive to light.  Neck:     Thyroid: No thyromegaly.  Cardiovascular:     Rate and Rhythm: Normal rate and regular rhythm.     Heart sounds: Normal heart sounds. No murmur heard.   Pulmonary:     Effort: Pulmonary effort is normal. No respiratory distress.     Breath sounds: Normal breath sounds. No wheezing or rhonchi.  Abdominal:     General: Bowel sounds are normal. There is no distension.     Palpations: Abdomen is soft. There is no mass.     Tenderness: There is no abdominal tenderness.  Musculoskeletal:     Cervical back: Neck supple.  Lymphadenopathy:     Cervical: No cervical adenopathy.  Skin:    General: Skin is warm and dry.  Neurological:     Mental Status: She is alert and oriented to person, place, and time.     Motor: No abnormal muscle tone.     Coordination: Coordination normal.  Psychiatric:        Behavior: Behavior normal.     A/P:  1. Annual physical exam - UTD on labs - done with GYN last - discussed importance of regular CV exercise, healthy diet, adequate sleep - UTD on dental and vision exams - UTD on PAP, mammo, dexa, colonoscopy - immunizations UTD - next CPE in 1year  2. Vitamin D deficiency - GYN recommended Vit D supplement 1000-2000IU  3. Hypothyroidism following radioiodine therapy - cont on levothyroxine daily, last TSH slightly low but will cont current dose and recheck in 6 mo or sooner PRN - TSH; Future - T4, free; Future - T3; Future  4. Anxiety 5. Recurrent major depressive  disorder, in partial remission (HCC) - stable, controlled Refill: - sertraline 100mg  daily  This visit occurred during the SARS-CoV-2 public health emergency.  Safety protocols were in place, including screening questions prior to the visit, additional usage of staff PPE, and extensive cleaning of exam room while observing appropriate contact time as indicated for disinfecting solutions.

## 2019-11-04 NOTE — Addendum Note (Signed)
Addended by: Waymond Cera on: 11/04/2019 10:31 AM   Modules accepted: Orders

## 2019-11-13 ENCOUNTER — Encounter (INDEPENDENT_AMBULATORY_CARE_PROVIDER_SITE_OTHER): Payer: BC Managed Care – PPO | Admitting: Family Medicine

## 2019-11-13 DIAGNOSIS — L03113 Cellulitis of right upper limb: Secondary | ICD-10-CM

## 2019-11-13 DIAGNOSIS — W5501XA Bitten by cat, initial encounter: Secondary | ICD-10-CM | POA: Diagnosis not present

## 2019-11-13 MED ORDER — AMOXICILLIN-POT CLAVULANATE 875-125 MG PO TABS: 1.0000 | ORAL_TABLET | Freq: Two times a day (BID) | ORAL | 0 refills | Status: DC

## 2019-12-23 ENCOUNTER — Telehealth: Payer: Self-pay

## 2019-12-23 NOTE — Telephone Encounter (Signed)
AEX 10/2019 with JJ Last MMG [facility] at office 11/2018- Negative   Spoke with pt. Pt asking about order or referral needing for screening MMG. Pt advised can call either [facility] or [facility] to schedule her screening MMG and an order is not needed. Pt advised to have results sent to Dr Oscar La for review. Pt agreeable and verbalized understanding. Thankful for advice.   Routing to Dr Oscar La for review Encounter closed

## 2019-12-23 NOTE — Telephone Encounter (Signed)
Patient is calling in regards to needing a mammogram. Patient states she would need to be referred.

## 2020-01-21 ENCOUNTER — Other Ambulatory Visit: Payer: Self-pay | Admitting: Obstetrics and Gynecology

## 2020-01-21 DIAGNOSIS — Z1231 Encounter for screening mammogram for malignant neoplasm of breast: Secondary | ICD-10-CM

## 2020-02-02 DIAGNOSIS — Z1231 Encounter for screening mammogram for malignant neoplasm of breast: Secondary | ICD-10-CM

## 2020-03-04 ENCOUNTER — Other Ambulatory Visit: Payer: Self-pay | Admitting: Obstetrics and Gynecology

## 2020-03-04 DIAGNOSIS — Z1231 Encounter for screening mammogram for malignant neoplasm of breast: Secondary | ICD-10-CM

## 2020-03-24 ENCOUNTER — Ambulatory Visit
Admission: RE | Admit: 2020-03-24 | Discharge: 2020-03-24 | Disposition: A | Discharge status: Home / Self Care | Payer: Self-pay | Source: Ambulatory Visit

## 2020-03-24 ENCOUNTER — Other Ambulatory Visit: Payer: Self-pay

## 2020-03-24 DIAGNOSIS — Z1231 Encounter for screening mammogram for malignant neoplasm of breast: Secondary | ICD-10-CM

## 2020-03-28 ENCOUNTER — Encounter: Payer: Self-pay | Admitting: Family Medicine

## 2020-03-31 ENCOUNTER — Other Ambulatory Visit: Payer: Self-pay

## 2020-03-31 NOTE — Addendum Note (Signed)
Addended by: Varney Biles on: 03/31/2020 04:51 PM   Modules accepted: Orders

## 2020-04-01 ENCOUNTER — Other Ambulatory Visit (INDEPENDENT_AMBULATORY_CARE_PROVIDER_SITE_OTHER): Payer: BC Managed Care – PPO

## 2020-04-01 DIAGNOSIS — E890 Postprocedural hypothyroidism: Secondary | ICD-10-CM | POA: Diagnosis not present

## 2020-04-01 NOTE — Progress Notes (Signed)
Per orders of Dr.C pt is here for lab work pt tolerated lab draw well.

## 2020-04-02 LAB — TSH: TSH: 0.79 mIU/L

## 2020-04-02 LAB — T3: T3, Total: 67 ng/dL — ABNORMAL LOW (ref 76–181)

## 2020-04-02 LAB — T4, FREE: Free T4: 1.5 ng/dL (ref 0.8–1.8)

## 2020-04-06 ENCOUNTER — Encounter: Payer: Self-pay | Admitting: Family Medicine

## 2020-04-25 ENCOUNTER — Encounter: Payer: Self-pay | Admitting: Obstetrics and Gynecology

## 2020-05-04 ENCOUNTER — Telehealth: Payer: Self-pay

## 2020-05-04 NOTE — Telephone Encounter (Signed)
Patient has appointment for breast exam on Friday afternoon. She had a normal mammogram 03/24/20 but for the last month her breasts have been very painful to the point of wanting to put ice packs on them and keeping her awake at night. So she scheduled the visit. She said since scheduling she changed her bra and she thinks the pain is much improved although still present. She was debating canceling the appointment and waiting a few weeks.  I told her it would be up to her but certainly would be a good thing to come and let Dr. Oscar La examine her breasts and consult with her about it.  She decided to keep the scheduled appointment on Friday.

## 2020-05-06 ENCOUNTER — Encounter: Payer: Self-pay | Admitting: Obstetrics and Gynecology

## 2020-05-06 ENCOUNTER — Other Ambulatory Visit: Payer: Self-pay

## 2020-05-06 ENCOUNTER — Ambulatory Visit: Payer: BC Managed Care – PPO | Admitting: Obstetrics and Gynecology

## 2020-05-06 VITALS — BP 122/74 | HR 72 | Wt 183.0 lb

## 2020-05-06 DIAGNOSIS — N644 Mastodynia: Secondary | ICD-10-CM | POA: Diagnosis not present

## 2020-05-06 NOTE — Progress Notes (Signed)
GYNECOLOGY  VISIT   HPI: 55 y.o.   Married White or Caucasian Not Hispanic or Latino  female   [id number] with No LMP recorded. Patient has had an ablation.   here for bilateral breast pain   She just went off OCP's in the fall of 2021. H/O endometrial ablation  FSH in 9/21 was in a premenopausal range so she was started on micronor.  She developed a dull, bilateral, symmetrical breast pain in February of 2022. Her pain was up to a 6/10 in severity. The pain lasted for about a month, mostly gone now. Currently pain is a 1/10 in severity. She has gotten a better fitting bra and thinks this is helping. She drinks a lot of caffeine. She drinks ~16-20 oz of coffee in the am.   She has been under a lot of stress at work. She has had some chest tightness (will f/u with primary).   She had a normal mammogram on 03/29/20  GYNECOLOGIC HISTORY: No LMP recorded. Patient has had an ablation. Contraception:Micronor Menopausal hormone therapy: none        OB History    Gravida  0   Para  0   Term  0   Preterm  0   AB  0   Living  0     SAB  0   IAB  0   Ectopic  0   Multiple  0   Live Births  0              Patient Active Problem List   Diagnosis Date Noted  . Overweight with body mass index (BMI) 25.0-29.9 08/12/2017  . Anxiety 08/12/2017  . Lumbar pain 08/12/2017  . Oral contraceptive use 08/12/2017  . History of endometrial polyp, s/p hysteroscopy and ablation 08/12/2017  . Scalp psoriasis, Rx fluocinolone oil 01/10/2015  . Hypothyroidism, postradioiodine therapy 11/25/2009  . Dysthymia, on Zoloft 11/25/2009  . Allergic rhinitis, controlled wiht flonase 11/25/2009    Past Medical History:  Diagnosis Date  . Allergic rhinitis   . Dysthymia   . Hypothyroidism   . Rosacea     Past Surgical History:  Procedure Laterality Date  . HYSTEROSCOPY  04/2015   WITH ABLATION  . HYSTEROSCOPY WITH D & C N/A 09/24/2014   Procedure: DILATATION AND CURETTAGE /HYSTEROSCOPY;   Surgeon: Marlow Baars, MD;  Location: [facility];  Service: Gynecology;  Laterality: N/A;  . WISDOM TOOTH EXTRACTION      Current Outpatient Medications  Medication Sig Dispense Refill  . fluticasone (FLONASE) 50 MCG/ACT nasal spray 2 SPRAYS BOTH NOSTRILS DAILY 16 g 4  . levothyroxine (SYNTHROID) 200 MCG tablet TAKE ONE TABLET BY MOUTH DAILY BEFORE BREAKFAST 90 tablet 3  . Loratadine 10 MG CAPS Claritin    . metroNIDAZOLE (METROGEL) 0.75 % gel Apply 1 application topically as needed.     . norethindrone (MICRONOR) 0.35 MG tablet Take 1 tablet (0.35 mg total) by mouth daily. 90 tablet 3  . sertraline (ZOLOFT) 100 MG tablet Take 1 tablet (100 mg total) by mouth daily. 90 tablet 3   No current facility-administered medications for this visit.     ALLERGIES: Patient has no known allergies.  Family History  Problem Relation Age of Onset  . Other Mother        nectrotizing of the colon-had ileostomy that has been reversed  . Bipolar disorder Mother   . Hyperlipidemia Father   . Colon polyps Father   . Cancer Father  bile duct cancer  . Mental illness Brother        undefined, unable to live independently  . Colon cancer Neg Hx   . Esophageal cancer Neg Hx   . Rectal cancer Neg Hx   . Stomach cancer Neg Hx     Social History   Socioeconomic History  . Marital status: Married    Spouse name: Not on file  . Number of children: Not on file  . Years of education: Not on file  . Highest education level: Not on file  Occupational History  . Occupation: SPEECH LANGUAGE PATHOLOGIST    Employer: [employer]  Tobacco Use  . Smoking status: Never Smoker  . Smokeless tobacco: Never Used  Vaping Use  . Vaping Use: Never used  Substance and Sexual Activity  . Alcohol use: Yes    Comment: occ  . Drug use: No  . Sexual activity: Yes    Birth control/protection: Pill  Other Topics Concern  . Not on file  Social History Narrative  . Not on file   Social  Determinants of Health   Financial Resource Strain: Not on file  Food Insecurity: Not on file  Transportation Needs: Not on file  Physical Activity: Not on file  Stress: Not on file  Social Connections: Not on file  Intimate Partner Violence: Not on file    Review of Systems  Constitutional: Negative.   HENT: Negative.   Eyes: Negative.   Respiratory: Negative.   Cardiovascular: Negative.   Gastrointestinal: Negative.   Genitourinary: Negative.   Musculoskeletal: Negative.   Skin: Negative.   Neurological: Negative.   Endo/Heme/Allergies: Negative.   Psychiatric/Behavioral: Negative.     PHYSICAL EXAMINATION:    BP 122/74   Pulse 72   Wt 183 lb (83 kg)   BMI 26.64 kg/m     General appearance: alert, cooperative and appears stated age Breasts: normal appearance, no masses or tenderness  No axillary or supraclavicular adenopathy  1. Mastalgia Normal exam, normal mammogram. Symptoms have markedly improved. Discussed with symmetrical pain it is unlikely to be anything serious. Discussed decreasing her caffeine intake Can use ice Can take tylenol or ibuprofen if needed Discussed the importance of a well fitting bra.   ~20 minutes in total patient care

## 2020-05-06 NOTE — Patient Instructions (Signed)

## 2020-06-08 ENCOUNTER — Encounter: Payer: Self-pay | Admitting: Physician Assistant

## 2020-06-08 ENCOUNTER — Other Ambulatory Visit: Payer: Self-pay

## 2020-06-08 ENCOUNTER — Ambulatory Visit (INDEPENDENT_AMBULATORY_CARE_PROVIDER_SITE_OTHER): Payer: BC Managed Care – PPO | Admitting: Physician Assistant

## 2020-06-08 VITALS — BP 134/81 | HR 67 | Ht 69.0 in | Wt 174.0 lb

## 2020-06-08 DIAGNOSIS — F419 Anxiety disorder, unspecified: Secondary | ICD-10-CM

## 2020-06-08 DIAGNOSIS — F3341 Major depressive disorder, recurrent, in partial remission: Secondary | ICD-10-CM

## 2020-06-08 MED ORDER — SERTRALINE HCL 100 MG PO TABS: 150.0000 mg | ORAL_TABLET | Freq: Every day | ORAL | 0 refills | Status: DC

## 2020-06-08 NOTE — Progress Notes (Signed)
[facility] MD/PA/NP Initial Note  06/08/2020 9:14 PM Sara Hull  MRN:  [id number]  Chief Complaint:  Chief Complaint    Establish Care      HPI:   Here to discuss her meds. See past psych hx.   Self-esteem problems mostly at work. Has been working from home since the pandemic started, but is now being forced to go back in to office. When she found that out, went through a time of grieving b/c she was 'losing' the ability to work from home. Has been angry, then bargaining,and now accepting. In the winter for a few months she cried a lot and it was overwhelmed. Under a lot of stress. Had been ruminating but all these sx are much better.   Goes to sleep fine, but does wake up sometimes.  Is able to go back to sleep pretty quickly.  Feels rested when she gets up.   Going to counseling for the past few years.   Visit Diagnosis:    ICD-10-CM   1. Recurrent major depressive disorder, in partial remission (HCC)  F33.41   2. Anxiety  F41.9 sertraline (ZOLOFT) 100 MG tablet    Past Psychiatric History:   At 55 yo, was really depressed, was crying all the time, wasn't sleeping well, went to counseling, and saw a psychiatriast at [facility] and was put on Zoloft.  That was life-changing. Was on annual check-ups to monitor, so she just started having her PCP write the Rx.  No past suicide attempts, symptoms of eating disorder, self-harm, and no hospitalizations for mental health reasons.  Past medications for mental health diagnoses include: Zoloft, Wellbutrin for only a few months for Sexual dysfunction   Past Medical History:  Past Medical History:  Diagnosis Date  . Allergic rhinitis   . Dysthymia   . Hypothyroidism   . Rosacea     Past Surgical History:  Procedure Laterality Date  . HYSTEROSCOPY  04/2015   WITH ABLATION  . HYSTEROSCOPY WITH D & C N/A 09/24/2014   Procedure: DILATATION AND CURETTAGE /HYSTEROSCOPY;  Surgeon: Marlow Baars, MD;  Location: [facility];   Service: Gynecology;  Laterality: N/A;  . WISDOM TOOTH EXTRACTION      Family Psychiatric History: see below  Family History:  Family History  Problem Relation Age of Onset  . Other Mother        nectrotizing of the colon-had ileostomy that has been reversed  . Bipolar disorder Mother   . Schizophrenia Mother   . Dementia Mother   . Heart disease Mother   . Hyperlipidemia Father   . Colon polyps Father   . Cancer Father        bile duct cancer  . Mental illness Brother        undefined, unable to live independently  . Alcohol abuse Brother   . Drug abuse Brother   . Depression Maternal Grandmother   . Cancer Paternal Grandfather   . Colon cancer Neg Hx   . Esophageal cancer Neg Hx   . Rectal cancer Neg Hx   . Stomach cancer Neg Hx     Social History:  Social History   Socioeconomic History  . Marital status: Married    Spouse name: Not on file  . Number of children: Not on file  . Years of education: Not on file  . Highest education level: Master's degree (e.g., MA, MS, MEng, MEd, MSW, MBA)  Occupational History  . Occupation: SPEECH LANGUAGE PATHOLOGIST  Employer: [employer]  Tobacco Use  . Smoking status: Never Smoker  . Smokeless tobacco: Never Used  Vaping Use  . Vaping Use: Never used  Substance and Sexual Activity  . Alcohol use: Yes    Comment: occ  . Drug use: No  . Sexual activity: Yes    Birth control/protection: Pill  Other Topics Concern  . Not on file  Social History Narrative   Married late in life. She nor husband had been married before and neither have kids.   Moved a lot as a kid, Dad was an Psychologist, educational in cooperate office and he moved places within [employer]. In 11th grade, they moved to [place] and that's where their home base is now. Sara Hull has been in [place] for 25 years now. Mom did different jobs, had a hard time keeping them d/t mental health.      No legal.   Religion-Christian. Grew up [employer].    Caffeine- 3 cups of coffee/d.  Sometimes in afternoon will have Starbucks coffee   Social Determinants of Health   Financial Resource Strain: Low Risk   . Difficulty of Paying Living Expenses: Not hard at all  Food Insecurity: No Food Insecurity  . Worried About Programme researcher, broadcasting/film/video in the Last Year: Never true  . Ran Out of Food in the Last Year: Never true  Transportation Needs: No Transportation Needs  . Lack of Transportation (Medical): No  . Lack of Transportation (Non-Medical): No  Physical Activity: Insufficiently Active  . Days of Exercise per Week: 6 days  . Minutes of Exercise per Session: 20 min  Stress: Stress Concern Present  . Feeling of Stress : Very much  Social Connections: Moderately Isolated  . Frequency of Communication with Friends and Family: Three times a week  . Frequency of Social Gatherings with Friends and Family: Three times a week  . Attends Religious Services: Never  . Active Member of Clubs or Organizations: No  . Attends Banker Meetings: Never  . Marital Status: Married    Allergies: No Known Allergies  Metabolic Disorder Labs: Lab Results  Component Value Date   HGBA1C 5.1 08/12/2017   No results found for: PROLACTIN Lab Results  Component Value Date   CHOL 142 10/29/2019   TRIG 105 10/29/2019   HDL 41 10/29/2019   CHOLHDL 3.5 10/29/2019   VLDL 58.8 11/05/2018   LDLCALC 82 10/29/2019   LDLCALC 101 (H) 11/05/2018   Lab Results  Component Value Date   TSH 0.79 04/01/2020   TSH 0.392 (L) 10/29/2019    Therapeutic Level Labs: No results found for: LITHIUM No results found for: VALPROATE No components found for:  CBMZ  Current Medications: Current Outpatient Medications  Medication Sig Dispense Refill  . fluticasone (FLONASE) 50 MCG/ACT nasal spray 2 SPRAYS BOTH NOSTRILS DAILY 16 g 4  . levothyroxine (SYNTHROID) 200 MCG tablet TAKE ONE TABLET BY MOUTH DAILY BEFORE BREAKFAST 90 tablet 3  . Loratadine 10 MG CAPS  Claritin    . metroNIDAZOLE (METROGEL) 0.75 % gel Apply 1 application topically as needed.     . norethindrone (MICRONOR) 0.35 MG tablet Take 1 tablet (0.35 mg total) by mouth daily. 90 tablet 3  . sertraline (ZOLOFT) 100 MG tablet Take 1.5 tablets (150 mg total) by mouth daily. 135 tablet 0   No current facility-administered medications for this visit.    Medication Side Effects: none  Orders placed this visit:  No orders of the  defined types were placed in this encounter.   Psychiatric Specialty Exam:  Review of Systems  Constitutional: Positive for fatigue.  HENT: Negative.   Eyes: Negative.   Respiratory: Positive for chest tightness.        With anxiety  Cardiovascular: Negative.   Gastrointestinal: Negative.   Endocrine: Negative.   Genitourinary: Negative.   Musculoskeletal: Positive for back pain.  Skin: Negative.   Allergic/Immunologic: Negative.   Neurological: Negative.   Hematological: Negative.   Psychiatric/Behavioral: Positive for dysphoric mood.    Blood pressure 134/81, pulse 67, height 5\' 9"  (1.753 m), weight 174 lb (78.9 kg).Body mass index is 25.7 kg/m.  General Appearance: Casual, Neat and Well Groomed  Eye Contact:  Fair  Speech:  Clear and Coherent and Normal Rate  Volume:  Normal  Mood:  Euthymic  Affect:  Congruent  Thought Process:  Goal Directed and Descriptions of Associations: Circumstantial  Orientation:  Full (Time, Place, and Person)  Thought Content: Logical   Suicidal Thoughts:  No  Homicidal Thoughts:  No  Memory:  WNL  Judgement:  Good  Insight:  Good  Psychomotor Activity:  Normal  Concentration:  Concentration: Good  Recall:  Good  Fund of Knowledge: Good  Language: Good  Assets:  Desire for Improvement  ADL's:  Intact  Cognition: WNL  Prognosis:  Good   Screenings:  GAD-7   Flowsheet Row Office Visit from 06/08/2020 in [facility] Office Visit from 05/06/2018 in [facility]   Total GAD-7 Score 13 4    PHQ2-9   Flowsheet Row Office Visit from 06/08/2020 in [facility] Office Visit from 11/04/2019 in [facility] Office Visit from 11/05/2018 in [facility] [place] Visit from 05/06/2018 in [facility] [place] from 07/02/2016 in [facility] Total Score 1 0 0 0 0  PHQ-9 Total Score -- -- 1 1 --      Receiving Psychotherapy: Yes   Treatment Plan/Recommendations:  PDMP was reviewed. I provided 60 minutes of face-to-face time during this encounter, including time spent before and after the visit and records review and charting. Overall she seems to be doing well with the Zoloft but I would recommend increasing the dose.  We briefly talked about changing medications altogether, but she has a long history with Zoloft, no major side effects or issues with it so I recommend bumping that up before we try something else.  Hopefully, we will not have to. Briefly discussed healthy lifestyle choices, diet, exercise. Increase Zoloft 100 mg to 1.5 pills daily. Continue therapy. Return in 6 to 8 weeks.  [employer], PA-C

## 2020-07-18 ENCOUNTER — Encounter: Payer: Self-pay | Admitting: Family Medicine

## 2020-07-18 NOTE — Telephone Encounter (Signed)
Please advise on message below. Thanks. Dm/cma  

## 2020-08-02 ENCOUNTER — Telehealth: Payer: Self-pay | Admitting: Physician Assistant

## 2020-08-02 NOTE — Telephone Encounter (Signed)
Sara Hull called requesting refill for Setraline 100mg . States at last visit she was told that she could up dosage to 150mg  when she was ready. Appt  6/29. Ph: [phone]. Pharmacy [facility] [address] [place], [address].

## 2020-08-02 NOTE — Telephone Encounter (Signed)
Okay to increase her dose to 150 mg Sertraline?

## 2020-08-02 NOTE — Telephone Encounter (Signed)
On 4/27 I sent in a 3 month Rx for total of 150 mg. So [facility].

## 2020-08-03 ENCOUNTER — Other Ambulatory Visit: Payer: Self-pay

## 2020-08-03 DIAGNOSIS — F419 Anxiety disorder, unspecified: Secondary | ICD-10-CM

## 2020-08-03 MED ORDER — SERTRALINE HCL 100 MG PO TABS: 150.0000 mg | ORAL_TABLET | Freq: Every day | ORAL | 0 refills | Status: DC

## 2020-08-03 NOTE — Telephone Encounter (Signed)
Rx sent 

## 2020-08-03 NOTE — Telephone Encounter (Signed)
Addendum to attached message on 08/02/20. [facility] find out if [facility] was ready to pick up and they told her they don't have the prescription from 4/27. [facility] [address], [place], [place] [address]. Phone number is [phone].

## 2020-08-10 ENCOUNTER — Encounter: Payer: Self-pay | Admitting: Physician Assistant

## 2020-08-10 ENCOUNTER — Other Ambulatory Visit: Payer: Self-pay

## 2020-08-10 ENCOUNTER — Ambulatory Visit (INDEPENDENT_AMBULATORY_CARE_PROVIDER_SITE_OTHER): Payer: BC Managed Care – PPO | Admitting: Physician Assistant

## 2020-08-10 DIAGNOSIS — F419 Anxiety disorder, unspecified: Secondary | ICD-10-CM

## 2020-08-10 DIAGNOSIS — F3341 Major depressive disorder, recurrent, in partial remission: Secondary | ICD-10-CM

## 2020-08-10 NOTE — Progress Notes (Signed)
[facility]  Patient ID: Sara Hull,  MRN: [id number]  PCP: Overton Mam, DO  Date of Evaluation: 08/10/2020 Time spent:20 minutes  Chief Complaint:  Chief Complaint   Anxiety; Depression; Follow-up     HISTORY/CURRENT STATUS: HPI For 2 month f/u.  Zoloft was increased at last OV.  States it was good timing.  See social history below.  A lot has been going on and she has been able to handle things better than she probably would have on the previous dose of the Zoloft.  Energy and motivation are good.  She sleeps well most of the time.  Not isolating.  Work is busy but okay.  Not extremely anxious, feels that the Zoloft is helping that as well.  She does have some difficulty enjoying things, more so due to family illness, work Catering manager.  No suicidal or homicidal thoughts.  Review of Systems  Constitutional: Negative.   HENT: Negative.    Eyes: Negative.   Respiratory: Negative.    Cardiovascular: Negative.   Genitourinary: Negative.   Musculoskeletal: Negative.   Skin: Negative.   Neurological: Negative.   Endo/Heme/Allergies: Negative.   Psychiatric/Behavioral:         See HPI.    Individual Medical History/ Review of Systems: Changes? :Yes  Had Covid since LOV.  It was stressful because she was unable to go to the ER to be with her mom.  Past medications for mental health diagnoses include: Zoloft, Wellbutrin for only a few months for Sexual dysfunction  Allergies: Patient has no known allergies.  Current Medications:  Current Outpatient Medications:    fluticasone (FLONASE) 50 MCG/ACT nasal spray, 2 SPRAYS BOTH NOSTRILS DAILY, Disp: 16 g, Rfl: 4   levothyroxine (SYNTHROID) 200 MCG tablet, TAKE ONE TABLET BY MOUTH DAILY BEFORE BREAKFAST, Disp: 90 tablet, Rfl: 3   Loratadine 10 MG CAPS, Claritin, Disp: , Rfl:    metroNIDAZOLE (METROGEL) 0.75 % gel, Apply 1 application topically as needed. , Disp: , Rfl:    norethindrone (MICRONOR) 0.35 MG tablet,  Take 1 tablet (0.35 mg total) by mouth daily., Disp: 90 tablet, Rfl: 3   sertraline (ZOLOFT) 100 MG tablet, Take 1.5 tablets (150 mg total) by mouth daily., Disp: 135 tablet, Rfl: 0 Medication Side Effects: none  Family Medical/ Social History: Changes? yes  Her Mom had a couple of falls and went to ER several times. She's not doing well. Is in SNF.  Her MIL is in the hospital now. Her dog got sick.   MENTAL HEALTH EXAM:  There were no vitals taken for this visit.There is no height or weight on file to calculate BMI.  General Appearance: Casual and Well Groomed  Eye Contact:  Good  Speech:  Clear and Coherent and Normal Rate  Volume:  Normal  Mood:  Euthymic  Affect:  Appropriate  Thought Process:  Goal Directed and Descriptions of Associations: Circumstantial  Orientation:  Full (Time, Place, and Person)  Thought Content: Logical   Suicidal Thoughts:  No  Homicidal Thoughts:  No  Memory:  WNL  Judgement:  Good  Insight:  Good  Psychomotor Activity:  Normal  Concentration:  Concentration: Good  Recall:  Good  Fund of Knowledge: Good  Language: Good  Assets:  Desire for Improvement  ADL's:  Intact  Cognition: WNL  Prognosis:  Good    DIAGNOSES:    ICD-10-CM   1. Anxiety  F41.9     2. Recurrent major depressive disorder, in partial remission (HCC)  F33.41       Receiving Psychotherapy: Yes    RECOMMENDATIONS:  PDMP was reviewed. I provided 20 minutes of face to face time during this encounter, including time spent before and after the visit in records review, medical decision making, and charting.  Glad she is responding well to the higher dose of Zoloft so no changes need to be made. Continue Zoloft 100 mg, 1.5 pills daily. Ok to send in Zoloft RF till the end of the year, but she may have her PCP send refills after that. Continue therapy. Return as needed.  Melony Overly, PA-C

## 2020-09-04 ENCOUNTER — Other Ambulatory Visit: Payer: Self-pay | Admitting: Family Medicine

## 2020-09-04 ENCOUNTER — Other Ambulatory Visit: Payer: Self-pay | Admitting: Obstetrics and Gynecology

## 2020-09-04 DIAGNOSIS — E890 Postprocedural hypothyroidism: Secondary | ICD-10-CM

## 2020-10-28 ENCOUNTER — Other Ambulatory Visit: Payer: Self-pay | Admitting: *Deleted

## 2020-10-28 ENCOUNTER — Other Ambulatory Visit: Payer: Self-pay | Admitting: Obstetrics and Gynecology

## 2020-10-28 ENCOUNTER — Other Ambulatory Visit: Payer: Self-pay | Admitting: Physician Assistant

## 2020-10-28 DIAGNOSIS — F419 Anxiety disorder, unspecified: Secondary | ICD-10-CM

## 2020-11-03 ENCOUNTER — Ambulatory Visit: Payer: BC Managed Care – PPO | Admitting: Obstetrics and Gynecology

## 2020-11-08 ENCOUNTER — Ambulatory Visit: Payer: BC Managed Care – PPO | Admitting: Obstetrics and Gynecology

## 2020-11-22 ENCOUNTER — Other Ambulatory Visit: Payer: Self-pay | Admitting: Obstetrics and Gynecology

## 2020-12-09 ENCOUNTER — Encounter: Payer: Self-pay | Admitting: Obstetrics and Gynecology

## 2020-12-09 ENCOUNTER — Ambulatory Visit (INDEPENDENT_AMBULATORY_CARE_PROVIDER_SITE_OTHER): Payer: BC Managed Care – PPO | Admitting: Obstetrics and Gynecology

## 2020-12-09 ENCOUNTER — Other Ambulatory Visit: Payer: Self-pay

## 2020-12-09 VITALS — BP 110/67 | HR 67 | Ht 69.0 in | Wt 186.0 lb

## 2020-12-09 DIAGNOSIS — Z01419 Encounter for gynecological examination (general) (routine) without abnormal findings: Secondary | ICD-10-CM | POA: Diagnosis not present

## 2020-12-09 DIAGNOSIS — Z23 Encounter for immunization: Secondary | ICD-10-CM

## 2020-12-09 DIAGNOSIS — E559 Vitamin D deficiency, unspecified: Secondary | ICD-10-CM

## 2020-12-09 DIAGNOSIS — Z3041 Encounter for surveillance of contraceptive pills: Secondary | ICD-10-CM

## 2020-12-09 DIAGNOSIS — Z0000 Encounter for general adult medical examination without abnormal findings: Secondary | ICD-10-CM | POA: Diagnosis not present

## 2020-12-09 DIAGNOSIS — E038 Other specified hypothyroidism: Secondary | ICD-10-CM

## 2020-12-09 DIAGNOSIS — N912 Amenorrhea, unspecified: Secondary | ICD-10-CM

## 2020-12-09 NOTE — Patient Instructions (Signed)

## 2020-12-09 NOTE — Progress Notes (Signed)
55 y.o. G0P0000 Married White or Caucasian Not Hispanic or Latino female here for annual exam.   She was on [place]. H/O endometrial ablation in 3/17. She spotted a few times after the ablation, but not for years.  H/O removal of a large endometrial polyp in 8/16 then again in 3/17, did the ablation at that time. She was on OCP's when she got the polyps.  Last year her FSH was 5.3, she was started on POP. No vaginal bleeding. No change in her tolerable vasomotor symptoms. Mild vaginal dryness. Rarely sexually active, uses a lubricant with intercourse, no pain.     No LMP recorded. Patient has had an ablation.          Sexually active:  yes  The current method of family planning is POP    Exercising: No.  The patient does not participate in regular exercise at present. Smoker:  no  Health Maintenance: Pap:   10/29/19 WNL Hr HPV Neg, 09/19/15 WNL  History of abnormal Pap:  no MMG:  03/15/20 Bi-rads 1 neg  BMD:   11/17/2018 Normal  Colonoscopy: 10/10/2015 f/u 10 years TDaP:  01/10/15  Gardasil: none    reports that she has never smoked. She has never used smokeless tobacco. She reports current alcohol use of about 4.0 standard drinks per week. 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 Married x 6 years. Husband is a Risk manager, adjunct professor.   Past Medical History:  Diagnosis Date   Allergic rhinitis    Dysthymia    Hypothyroidism    Rosacea     Past Surgical History:  Procedure Laterality Date   HYSTEROSCOPY  04/2015   WITH ABLATION   HYSTEROSCOPY WITH D & C N/A 09/24/2014   Procedure: DILATATION AND CURETTAGE /HYSTEROSCOPY;  Surgeon: Marlow Baars, MD;  Location: [facility];  Service: Gynecology;  Laterality: N/A;   WISDOM TOOTH EXTRACTION      Current Outpatient Medications  Medication Sig Dispense Refill   fluticasone (FLONASE) 50 MCG/ACT nasal spray 2 SPRAYS BOTH NOSTRILS  DAILY 16 g 4   levothyroxine (SYNTHROID) 200 MCG tablet TAKE ONE TABLET BY MOUTH DAILY BEFORE BREAKFAST 90 tablet 3   Loratadine 10 MG CAPS Claritin     metroNIDAZOLE (METROGEL) 0.75 % gel Apply 1 application topically as needed.      norethindrone (MICRONOR) 0.35 MG tablet TAKE 1 TABLET BY MOUTH EVERY DAY 28 tablet 0   sertraline (ZOLOFT) 100 MG tablet TAKE 1.5 TABLETS (150MG  TOTAL) BY MOUTH DAILY 135 tablet 0   No current facility-administered medications for this visit.    Family History  Problem Relation Age of Onset   Other Mother        nectrotizing of the colon-had ileostomy that has been reversed   Bipolar disorder Mother    Schizophrenia Mother    Dementia Mother    Heart disease Mother    Hyperlipidemia Father    Colon polyps Father    Cancer Father        bile duct cancer   Mental illness Brother        undefined, unable to live independently   Alcohol abuse Brother    Drug abuse Brother    Depression Maternal Grandmother    Cancer Paternal Grandfather    Colon cancer Neg Hx    Esophageal cancer Neg Hx    Rectal cancer Neg Hx  Stomach cancer Neg Hx   Mom died in 2022/08/27.   Review of Systems  All other systems reviewed and are negative.  Exam:   BP 110/67   Pulse 67   Ht 5\' 9"  (1.753 m)   Wt 186 lb (84.4 kg)   SpO2 99%   BMI 27.47 kg/m   Weight change: @WEIGHTCHANGE @ Height:   Height: 5\' 9"  (175.3 cm)  Ht Readings from Last 3 Encounters:  12/09/20 5\' 9"  (1.753 m)  11/04/19 5' 9.5" (1.765 m)  10/29/19 5\' 9"  (1.753 m)    General appearance: alert, cooperative and appears stated age Head: Normocephalic, without obvious abnormality, atraumatic Neck: no adenopathy, supple, symmetrical, trachea midline and thyroid normal to inspection and palpation Lungs: clear to auscultation bilaterally Cardiovascular: regular rate and rhythm Breasts: normal appearance, no masses or tenderness Abdomen: soft, non-tender; non distended,  no masses,  no  organomegaly Extremities: extremities normal, atraumatic, no cyanosis or edema Skin: Skin color, texture, turgor normal. No rashes or lesions Lymph nodes: Cervical, supraclavicular, and axillary nodes normal. No abnormal inguinal nodes palpated Neurologic: Grossly normal   Pelvic: External genitalia:  no lesions              Urethra:  normal appearing urethra with no masses, tenderness or lesions              Bartholins and Skenes: normal                 Vagina: mildly atrophic appearing vagina with normal color and discharge, no lesions              Cervix: no lesions               Bimanual Exam:  Uterus:  normal size, contour, position, consistency, mobility, non-tender              Adnexa: no mass, fullness, tenderness               Rectovaginal: Confirms               Anus:  normal sphincter tone, no lesions  12/11/20 chaperoned for the exam.  1. Well woman exam Discussed breast self exam Discussed calcium and vit D intake No pap this year Mammogram in 2/23 Colonoscopy UTD  2. Laboratory exam ordered as part of routine general medical examination - CBC; Future - Comprehensive metabolic panel; Future - Lipid panel; Future  3. Need for immunization against influenza - Flu Vaccine QUAD 77mo+IM (Fluarix, Fluzone & Alfiuria Quad PF)  4. Amenorrhea - Follicle stimulating hormone; Future  5. Other specified hypothyroidism - TSH; Future -will need a refill on synthroid.  6. Encounter for surveillance of contraceptive pills Depending on FSH, may refill her POP - Follicle stimulating hormone; Future  7. Vitamin D deficiency Currently on POP - VITAMIN D 25 Hydroxy (Vit-D Deficiency, Fractures); Future

## 2020-12-18 ENCOUNTER — Other Ambulatory Visit: Payer: Self-pay | Admitting: Obstetrics and Gynecology

## 2020-12-19 ENCOUNTER — Other Ambulatory Visit: Payer: BC Managed Care – PPO

## 2020-12-19 ENCOUNTER — Other Ambulatory Visit: Payer: Self-pay

## 2020-12-19 DIAGNOSIS — Z0000 Encounter for general adult medical examination without abnormal findings: Secondary | ICD-10-CM

## 2020-12-19 DIAGNOSIS — E559 Vitamin D deficiency, unspecified: Secondary | ICD-10-CM

## 2020-12-19 DIAGNOSIS — N912 Amenorrhea, unspecified: Secondary | ICD-10-CM

## 2020-12-19 DIAGNOSIS — E038 Other specified hypothyroidism: Secondary | ICD-10-CM

## 2020-12-19 DIAGNOSIS — Z3041 Encounter for surveillance of contraceptive pills: Secondary | ICD-10-CM

## 2020-12-19 NOTE — Telephone Encounter (Signed)
Per note on 12/03/20 "Depending on [facility], may refill her POP"  Patient scheduled to have [facility] checked today.

## 2020-12-20 ENCOUNTER — Encounter: Payer: Self-pay | Admitting: Obstetrics and Gynecology

## 2020-12-20 LAB — LIPID PANEL
Cholesterol: 159 mg/dL (ref <200)
HDL: 46 mg/dL — ABNORMAL LOW (ref >=50)
LDL Cholesterol (Calc): 88 mg/dL (calc)
Non-HDL Cholesterol (Calc): 113 mg/dL (calc) (ref <130)
Total CHOL/HDL Ratio: 3.5 (calc) (ref <5.0)
Triglycerides: 145 mg/dL (ref <150)

## 2020-12-20 LAB — COMPREHENSIVE METABOLIC PANEL
AG Ratio: 1.2 (calc) (ref 1.0–2.5)
ALT: 26 U/L (ref 6–29)
AST: 25 U/L (ref 10–35)
Albumin: 4.1 g/dL (ref 3.6–5.1)
Alkaline phosphatase (APISO): 72 U/L (ref 37–153)
BUN: 23 mg/dL (ref 7–25)
CO2: 30 mmol/L (ref 20–32)
Calcium: 9.5 mg/dL (ref 8.6–10.4)
Chloride: 102 mmol/L (ref 98–110)
Creat: 0.9 mg/dL (ref 0.50–1.03)
Globulin: 3.3 g/dL (calc) (ref 1.9–3.7)
Glucose, Bld: 89 mg/dL (ref 65–99)
Potassium: 3.9 mmol/L (ref 3.5–5.3)
Sodium: 137 mmol/L (ref 135–146)
Total Bilirubin: 0.4 mg/dL (ref 0.2–1.2)
Total Protein: 7.4 g/dL (ref 6.1–8.1)

## 2020-12-20 LAB — CBC
HCT: 45.6 % — ABNORMAL HIGH (ref 35.0–45.0)
Hemoglobin: 15.5 g/dL (ref 11.7–15.5)
MCH: 31.8 pg (ref 27.0–33.0)
MCHC: 34 g/dL (ref 32.0–36.0)
MCV: 93.6 fL (ref 80.0–100.0)
MPV: 11.1 fL (ref 7.5–12.5)
Platelets: 183 10*3/uL (ref 140–400)
RBC: 4.87 10*6/uL (ref 3.80–5.10)
RDW: 11.8 % (ref 11.0–15.0)
WBC: 4.5 10*3/uL (ref 3.8–10.8)

## 2020-12-20 LAB — TSH: TSH: 0.73 mIU/L (ref 0.40–4.50)

## 2020-12-20 LAB — VITAMIN D 25 HYDROXY (VIT D DEFICIENCY, FRACTURES): Vit D, 25-Hydroxy: 77 ng/mL (ref 30–100)

## 2020-12-20 LAB — FOLLICLE STIMULATING HORMONE: FSH: 11.4 m[IU]/mL

## 2020-12-20 NOTE — Telephone Encounter (Signed)
FSH is still in a premenopausal range, will refill her POP's

## 2020-12-20 NOTE — Telephone Encounter (Signed)
Dr.Jertson FSH level  is at 11.4  Annual exam was on 12/09/20 Mammogram 03/2020

## 2021-02-18 ENCOUNTER — Other Ambulatory Visit: Payer: Self-pay | Admitting: Physician Assistant

## 2021-02-18 DIAGNOSIS — F419 Anxiety disorder, unspecified: Secondary | ICD-10-CM

## 2021-02-20 NOTE — Telephone Encounter (Signed)
30-day refill given. Patient's PCP left the practice and she has not yet established with another PCP.

## 2021-03-08 ENCOUNTER — Other Ambulatory Visit: Payer: Self-pay | Admitting: Obstetrics and Gynecology

## 2021-03-08 DIAGNOSIS — Z1231 Encounter for screening mammogram for malignant neoplasm of breast: Secondary | ICD-10-CM

## 2021-03-25 ENCOUNTER — Other Ambulatory Visit: Payer: Self-pay | Admitting: Physician Assistant

## 2021-03-25 DIAGNOSIS — F419 Anxiety disorder, unspecified: Secondary | ICD-10-CM

## 2021-03-29 NOTE — Telephone Encounter (Signed)
Shelton Silvas please contact pt and ask if her PCP is going to prescribe her Zoloft as last note mentions.

## 2021-03-30 NOTE — Telephone Encounter (Signed)
Called patient again. She doesn't want to pay for a visit just to get a refill. She has TH had explained to her that she needed to be seen at least every 6 months. She doesn't have an appt with her PCP until April. Will ask her OB/GYN to prescribe. Asked that I refuse this refill.

## 2021-03-30 NOTE — Telephone Encounter (Signed)
LVM for patient to [facility] - is PCP ordering sertraline?

## 2021-03-31 ENCOUNTER — Other Ambulatory Visit: Payer: Self-pay

## 2021-03-31 ENCOUNTER — Ambulatory Visit
Admission: RE | Admit: 2021-03-31 | Discharge: 2021-03-31 | Disposition: A | Discharge status: Home / Self Care | Payer: BC Managed Care – PPO | Source: Ambulatory Visit

## 2021-03-31 DIAGNOSIS — Z1231 Encounter for screening mammogram for malignant neoplasm of breast: Secondary | ICD-10-CM

## 2021-05-10 ENCOUNTER — Encounter: Payer: Self-pay | Admitting: Obstetrics and Gynecology

## 2021-05-11 ENCOUNTER — Other Ambulatory Visit: Payer: Self-pay | Admitting: Obstetrics and Gynecology

## 2021-05-11 DIAGNOSIS — F419 Anxiety disorder, unspecified: Secondary | ICD-10-CM

## 2021-05-11 MED ORDER — SERTRALINE HCL 100 MG PO TABS: ORAL_TABLET | ORAL | 0 refills | Status: DC

## 2021-11-02 ENCOUNTER — Encounter: Payer: Self-pay | Admitting: Obstetrics and Gynecology

## 2021-11-02 DIAGNOSIS — E890 Postprocedural hypothyroidism: Secondary | ICD-10-CM

## 2021-11-02 MED ORDER — LEVOTHYROXINE SODIUM 200 MCG PO TABS: ORAL_TABLET | ORAL | 0 refills | Status: DC

## 2021-11-02 NOTE — Telephone Encounter (Signed)
Last AEX 12/09/20. Scheduled 12/21/21.

## 2021-11-02 NOTE — Telephone Encounter (Signed)
Initially routed to Dr. Quincy Simmonds in error/rerouted to Dr. Talbert Nan.

## 2021-11-16 ENCOUNTER — Other Ambulatory Visit: Payer: Self-pay | Admitting: Obstetrics and Gynecology

## 2021-11-16 DIAGNOSIS — Z3041 Encounter for surveillance of contraceptive pills: Secondary | ICD-10-CM

## 2021-11-16 NOTE — Telephone Encounter (Signed)
Last AEX- 12/09/2020--scheduled for 12/21/2021. Last mammo 03/31/2021-neg birads 1  Per Pharmacy-last refill #84 tabs on 08/25/2021.  Per mychart msg on 11/02/21, pt would like hormone level checked, per your request for pt to stop POPs 1 week prior to appt.

## 2021-12-15 NOTE — Progress Notes (Unsigned)
56 y.o. G0P0000 Married White or Caucasian Not Hispanic or Latino female here for annual exam.  She is on POP, no cycles. H/O ablation, on POP for contraception. Stopped POP approximately 11/17/2021. She had a premenopausal [place] in 11/22.  Rarely sexually active, she does have vaginal dryness, helped with lubrication.  She has occasional, tolerable vasomotor symptoms.    Mild GSI with valsalva over the last few years, recently a little worse. Occurring several days a week, small amounts.   No LMP recorded. Patient has had an ablation.          Sexually active: Yes.    The current method of family planning is ablation..    Exercising: Yes.     Pilates, walking  Smoker:  no  Health Maintenance: Pap:  10/29/19 WNL Hr HPV Neg, 09/19/15 WNL   History of abnormal Pap:  no MMG:  03/31/21 density B Bi-rads bi-rads 1  BMD:   none  Colonoscopy: 10/10/15 f/u TDaP:  01/10/15 Gardasil: n/a   reports that she has never smoked. She has never used smokeless tobacco. She reports current alcohol use of about 4.0 standard drinks of alcohol per week. She reports that she does not use drugs. 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 Married x 7 years. Husband is a Pharmacist, hospital, adjunct professor.    Past Medical History:  Diagnosis Date   Allergic rhinitis    Dysthymia    Hypothyroidism    Rosacea     Past Surgical History:  Procedure Laterality Date   HYSTEROSCOPY  04/2015   WITH ABLATION   HYSTEROSCOPY WITH D & C N/A 09/24/2014   Procedure: DILATATION AND CURETTAGE /HYSTEROSCOPY;  Surgeon: Jerelyn Charles, MD;  Location: [facility];  Service: Gynecology;  Laterality: N/A;   WISDOM TOOTH EXTRACTION      Current Outpatient Medications  Medication Sig Dispense Refill   [facility] SUN[facility]CREEN SPF 30 EX apply topically to face and body daily for 30     fluticasone (FLONASE) 50 MCG/ACT nasal spray 2 SPRAYS BOTH NOSTRILS DAILY 16 g 4   levothyroxine  (SYNTHROID) 200 MCG tablet TAKE ONE TABLET BY MOUTH DAILY BEFORE BREAKFAST 90 tablet 0   Loratadine 10 MG CAPS Claritin     metroNIDAZOLE (METROGEL) 0.75 % gel Apply 1 application topically as needed.      norethindrone (MICRONOR) 0.35 MG tablet TAKE 1 TABLET BY MOUTH EVERY DAY (Patient not taking: Reported on 12/21/2021) 28 tablet 0   sertraline (ZOLOFT) 100 MG tablet TAKE 1.5 TABLETS (150MG  TOTAL) BY MOUTH DAILY 135 tablet 3   No current facility-administered medications for this visit.    Family History  Problem Relation Age of Onset   Other Mother        nectrotizing of the colon-had ileostomy that has been reversed   Bipolar disorder Mother    Schizophrenia Mother    Dementia Mother    Heart disease Mother    Hyperlipidemia Father    Colon polyps Father    Cancer Father        bile duct cancer   Mental illness Brother        undefined, unable to live independently   Alcohol abuse Brother    Drug abuse Brother    Depression Maternal Grandmother    Cancer Paternal Grandfather    Colon cancer Neg Hx    Esophageal cancer Neg Hx    Rectal cancer Neg Hx    Stomach  cancer Neg Hx     Review of Systems  Genitourinary:        Stress urinary incontinence    Exam:   BP 114/62 (BP Location: Right Arm, Patient Position: Sitting)   Pulse 85   Resp 18   Ht 5' 9.29" (1.76 m)   Wt 191 lb (86.6 kg)   SpO2 99%   BMI 27.97 kg/m   Weight change: @WEIGHTCHANGE @ Height:   Height: 5' 9.29" (176 cm)  Ht Readings from Last 3 Encounters:  12/21/21 5' 9.29" (1.76 m)  12/09/20 5\' 9"  (1.753 m)  11/04/19 5' 9.5" (1.765 m)    General appearance: alert, cooperative and appears stated age Head: Normocephalic, without obvious abnormality, atraumatic Neck: no adenopathy, supple, symmetrical, trachea midline and thyroid normal to inspection and palpation Lungs: clear to auscultation bilaterally Cardiovascular: regular rate and rhythm Breasts: normal appearance, no masses or  tenderness Abdomen: soft, non-tender; non distended,  no masses,  no organomegaly Extremities: extremities normal, atraumatic, no cyanosis or edema Skin: Skin color, texture, turgor normal. No rashes or lesions Lymph nodes: Cervical, supraclavicular, and axillary nodes normal. No abnormal inguinal nodes palpated Neurologic: Grossly normal   Pelvic: External genitalia:  no lesions              Urethra:  normal appearing urethra with no masses, tenderness or lesions              Bartholins and Skenes: normal                 Vagina: normal appearing vagina with normal color and discharge, no lesions              Cervix: no lesions               Bimanual Exam:  Uterus:  normal size, contour, position, consistency, mobility, non-tender              Adnexa: no mass, fullness, tenderness               Rectovaginal: Confirms               Anus:  normal sphincter tone, no lesions  , RN chaperoned for the exam.  1. Well woman exam Discussed breast self exam Discussed calcium and vit D intake No pap this year Mammogram in 2/24 Colonoscopy UTD  2. GSI (genuine stress incontinence), female - Urinalysis, Complete - Urine Culture  3. Urinary frequency - Urinalysis, Complete - Urine Culture  4. General counseling and advice on female contraception - Follicle stimulating hormone -If still low we discussed continuing POP's, using condoms, phexxi gel  5. Lipids abnormal - Lipid panel  6. Laboratory exam ordered as part of routine general medical examination - CBC - Comprehensive metabolic panel  7. Other specified hypothyroidism Needs refill once TSH is back - TSH  8. Vitamin D deficiency - VITAMIN D 25 Hydroxy (Vit-D Deficiency, Fractures)  9. Anxiety - sertraline (ZOLOFT) 100 MG tablet; TAKE 1.5 TABLETS (150MG  TOTAL) BY MOUTH DAILY  Dispense: 135 tablet; Refill: 3

## 2021-12-21 ENCOUNTER — Ambulatory Visit (INDEPENDENT_AMBULATORY_CARE_PROVIDER_SITE_OTHER): Payer: BC Managed Care – PPO | Admitting: Obstetrics and Gynecology

## 2021-12-21 ENCOUNTER — Encounter: Payer: Self-pay | Admitting: Obstetrics and Gynecology

## 2021-12-21 VITALS — BP 114/62 | HR 85 | Resp 18 | Ht 69.29 in | Wt 191.0 lb

## 2021-12-21 DIAGNOSIS — E7889 Other lipoprotein metabolism disorders: Secondary | ICD-10-CM | POA: Diagnosis not present

## 2021-12-21 DIAGNOSIS — E038 Other specified hypothyroidism: Secondary | ICD-10-CM

## 2021-12-21 DIAGNOSIS — N393 Stress incontinence (female) (male): Secondary | ICD-10-CM | POA: Diagnosis not present

## 2021-12-21 DIAGNOSIS — Z01419 Encounter for gynecological examination (general) (routine) without abnormal findings: Secondary | ICD-10-CM | POA: Diagnosis not present

## 2021-12-21 DIAGNOSIS — Z0000 Encounter for general adult medical examination without abnormal findings: Secondary | ICD-10-CM

## 2021-12-21 DIAGNOSIS — R350 Frequency of micturition: Secondary | ICD-10-CM

## 2021-12-21 DIAGNOSIS — E559 Vitamin D deficiency, unspecified: Secondary | ICD-10-CM

## 2021-12-21 DIAGNOSIS — F419 Anxiety disorder, unspecified: Secondary | ICD-10-CM

## 2021-12-21 DIAGNOSIS — Z3009 Encounter for other general counseling and advice on contraception: Secondary | ICD-10-CM

## 2021-12-21 LAB — URINALYSIS, COMPLETE
Bacteria, UA: NONE SEEN /HPF
Bilirubin Urine: NEGATIVE
Glucose, UA: NEGATIVE
Hgb urine dipstick: NEGATIVE
Hyaline Cast: NONE SEEN /LPF
Ketones, ur: NEGATIVE
Leukocytes,Ua: NEGATIVE
Nitrite: NEGATIVE
Protein, ur: NEGATIVE
RBC / HPF: NONE SEEN /HPF (ref 0–2)
Specific Gravity, Urine: 1.02 (ref 1.001–1.035)
WBC, UA: NONE SEEN /HPF (ref 0–5)
pH: 6.5 (ref 5.0–8.0)

## 2021-12-21 MED ORDER — SERTRALINE HCL 100 MG PO TABS: ORAL_TABLET | ORAL | 3 refills | Status: AC

## 2021-12-21 NOTE — Patient Instructions (Signed)

## 2021-12-22 LAB — URINE CULTURE
MICRO NUMBER:: 14167619
Result:: NO GROWTH
SPECIMEN QUALITY:: ADEQUATE

## 2021-12-22 LAB — COMPREHENSIVE METABOLIC PANEL
AG Ratio: 1.1 (calc) (ref 1.0–2.5)
ALT: 58 U/L — ABNORMAL HIGH (ref 6–29)
AST: 39 U/L — ABNORMAL HIGH (ref 10–35)
Albumin: 3.8 g/dL (ref 3.6–5.1)
Alkaline phosphatase (APISO): 95 U/L (ref 37–153)
BUN/Creatinine Ratio: 28 (calc) — ABNORMAL HIGH (ref 6–22)
BUN: 27 mg/dL — ABNORMAL HIGH (ref 7–25)
CO2: 26 mmol/L (ref 20–32)
Calcium: 9.1 mg/dL (ref 8.6–10.4)
Chloride: 101 mmol/L (ref 98–110)
Creat: 0.96 mg/dL (ref 0.50–1.03)
Globulin: 3.5 g/dL (calc) (ref 1.9–3.7)
Glucose, Bld: 87 mg/dL (ref 65–99)
Potassium: 4 mmol/L (ref 3.5–5.3)
Sodium: 138 mmol/L (ref 135–146)
Total Bilirubin: 0.3 mg/dL (ref 0.2–1.2)
Total Protein: 7.3 g/dL (ref 6.1–8.1)

## 2021-12-22 LAB — LIPID PANEL
Cholesterol: 140 mg/dL (ref <200)
HDL: 41 mg/dL — ABNORMAL LOW (ref >=50)
LDL Cholesterol (Calc): 73 mg/dL (calc)
Non-HDL Cholesterol (Calc): 99 mg/dL (calc) (ref <130)
Total CHOL/HDL Ratio: 3.4 (calc) (ref <5.0)
Triglycerides: 187 mg/dL — ABNORMAL HIGH (ref <150)

## 2021-12-22 LAB — CBC
HCT: 43.3 % (ref 35.0–45.0)
Hemoglobin: 14.5 g/dL (ref 11.7–15.5)
MCH: 31.5 pg (ref 27.0–33.0)
MCHC: 33.5 g/dL (ref 32.0–36.0)
MCV: 93.9 fL (ref 80.0–100.0)
MPV: 11 fL (ref 7.5–12.5)
Platelets: 196 10*3/uL (ref 140–400)
RBC: 4.61 10*6/uL (ref 3.80–5.10)
RDW: 11.6 % (ref 11.0–15.0)
WBC: 4.2 10*3/uL (ref 3.8–10.8)

## 2021-12-22 LAB — VITAMIN D 25 HYDROXY (VIT D DEFICIENCY, FRACTURES): Vit D, 25-Hydroxy: 63 ng/mL (ref 30–100)

## 2021-12-22 LAB — TSH: TSH: 1.51 mIU/L (ref 0.40–4.50)

## 2021-12-22 LAB — FOLLICLE STIMULATING HORMONE: FSH: 32.6 m[IU]/mL

## 2022-02-03 ENCOUNTER — Other Ambulatory Visit: Payer: Self-pay | Admitting: Obstetrics and Gynecology

## 2022-02-03 DIAGNOSIS — E890 Postprocedural hypothyroidism: Secondary | ICD-10-CM

## 2022-02-07 NOTE — Telephone Encounter (Signed)
Medication refill request: levothyroxine  Last AEX:  12/21/21 TSH done then result:1.51 -normal  Refill authorized: not sure if she needs more labs or why only 90 day supply was given.  #90 with 2 rf pended for today.

## 2022-03-19 ENCOUNTER — Other Ambulatory Visit: Payer: Self-pay | Admitting: Obstetrics and Gynecology

## 2022-03-19 DIAGNOSIS — Z1231 Encounter for screening mammogram for malignant neoplasm of breast: Secondary | ICD-10-CM

## 2022-04-26 ENCOUNTER — Ambulatory Visit
Admission: RE | Admit: 2022-04-26 | Discharge: 2022-04-26 | Disposition: A | Discharge status: Home / Self Care | Payer: BC Managed Care – PPO | Source: Ambulatory Visit | Attending: Obstetrics and Gynecology | Admitting: Obstetrics and Gynecology

## 2022-04-26 DIAGNOSIS — Z1231 Encounter for screening mammogram for malignant neoplasm of breast: Secondary | ICD-10-CM

## 2022-10-18 ENCOUNTER — Ambulatory Visit (INDEPENDENT_AMBULATORY_CARE_PROVIDER_SITE_OTHER): Payer: BC Managed Care – PPO | Admitting: Psychiatry

## 2022-10-18 DIAGNOSIS — F411 Generalized anxiety disorder: Secondary | ICD-10-CM

## 2022-10-18 NOTE — Progress Notes (Signed)
[facility] Counselor Initial Adult Exam  Name: Sara Hull Date: 10/18/2022 MRN: [id number] DOB: 26-Mar-1965 PCP: Laqueta Due., MD  Time spent: 60 minutes   Guardian/Payee:  patient    Paperwork requested:  No   Reason for Visit /Presenting Problem: anxiety "is my main problem", depression, work very stressful, stated she has long history of anxiety, some nights not sleeping well, "hard time making decisions", tearfulness  Mental Status Exam:    Appearance:   Casual     Behavior:  Appropriate, Sharing, and Motivated  Motor:  Normal  Speech/Language:   Clear and Coherent  Affect:  Depressed and anxious  Mood:  anxious and depressed  Thought process:  goal directed  Thought content:    Rumination and some obsessive thoughts  Sensory/Perceptual disturbances:    WNL  Orientation:  oriented to person, place, time/date, situation, day of week, month of year, year, and stated date of Sept. 5, 2024  Attention:  Fair  Concentration:  Good and Fair  Memory:  WNL  Fund of knowledge:   Good  Insight:    Good and Fair  Judgment:   Good  Impulse Control:  Good   Reported Symptoms:  see symptoms above  Risk Assessment: Danger to Self:  No Self-injurious Behavior: No Danger to Others: No Duty to Warn:no Physical Aggression / Violence:No  Access to Firearms a concern: No  Gang Involvement:No  Patient / guardian was educated about steps to take if suicide or homicide risk level increases between visits: denies any SI or HI. While future psychiatric events cannot be accurately predicted, the patient does not currently require acute inpatient psychiatric care and does not currently meet [facility] involuntary commitment criteria.  Substance Abuse History: Current substance abuse: No     Past Psychiatric History:   Previous psychological history is significant for anxiety and depression Outpatient Providers:several therapists in past including Rudi Rummage History of Psych  Hospitalization: No  Psychological Testing:  n/a    Abuse History: Victim of No.,  n/a    Report needed: No. Victim of Neglect:No. Perpetrator of  n/a   Witness / Exposure to Domestic Violence: No   Protective Services Involvement: No  Witness to [employer] Violence:  No   Family History:  patient confirms info below. Family History  Problem Relation Age of Onset   Other Mother        nectrotizing of the colon-had ileostomy that has been reversed   Bipolar disorder Mother    Schizophrenia Mother    Dementia Mother    Heart disease Mother    Hyperlipidemia Father    Colon polyps Father    Cancer Father        bile duct cancer   Mental illness Brother        undefined, unable to live independently   Alcohol abuse Brother    Drug abuse Brother    Depression Maternal Grandmother    Cancer Paternal Grandfather    Colon cancer Neg Hx    Esophageal cancer Neg Hx    Rectal cancer Neg Hx    Stomach cancer Neg Hx    Living situation: the patient lives with their spouse, no kids, patient "did not get married til in my 58's and so was husband, and we're best friends", married almost 8 yrs; Parents deceased, dad dies 2 1/2 died 2wks after she got married. Dad had cancer. Mom died 07/29/2022in a nursing home. 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  Patient emphasizes that her anxiety is her biggest issue and seems to be more of a generalized anxiety.  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 I do pray and believe in God.   Any cultural differences that may affect / interfere with treatment:  not applicable   Recreation/Hobbies: walking  Stressors:Health problems   Loss of both parent, has had some vision issues but cataract surgery has helped and she is to have "other eye done soon".    Other: n/a  ; Also has chronic low back pain  Strengths:  Supportive Relationships, Family, Friends, Spirituality, Hopefulness, Self Advocate, and Able to Communicate Effectively  Barriers:  "I want to get better". 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    Legal History: Pending legal issue / charges:  n/a. History of legal issue / charges:  n/a  Medical History/Surgical History:patient confirms info below. Past Medical History:  Diagnosis Date   Allergic rhinitis    Dysthymia    Hypothyroidism    Rosacea     Past Surgical History:  Procedure Laterality Date   HYSTEROSCOPY  04/2015   WITH ABLATION   HYSTEROSCOPY WITH D & C N/A 09/24/2014   Procedure: DILATATION AND CURETTAGE /HYSTEROSCOPY;  Surgeon: Marlow Baars, MD;  Location: [facility];  Service: Gynecology;  Laterality: N/A;   WISDOM TOOTH EXTRACTION      Medications:Patient confirms info below. Current Outpatient Medications  Medication Sig Dispense Refill   [employer] SUNSCREEN SPF 30 EX apply topically to face and body daily for 30     fluticasone (FLONASE) 50 MCG/ACT nasal spray 2 SPRAYS BOTH NOSTRILS DAILY 16 g 4   levothyroxine (SYNTHROID) 200 MCG tablet TAKE ONE TABLET BY MOUTH DAILY BEFORE BREAKFAST 90 tablet 2   Loratadine 10 MG CAPS Claritin     metroNIDAZOLE (METROGEL) 0.75 % gel Apply 1 application topically as needed.      norethindrone (MICRONOR) 0.35 MG  tablet TAKE 1 TABLET BY MOUTH EVERY DAY (Patient not taking: Reported on 12/21/2021) 28 tablet 0   sertraline (ZOLOFT) 100 MG tablet TAKE 1.5 TABLETS (150MG  TOTAL) BY MOUTH DAILY 135 tablet 3   No current facility-administered medications for this visit.    No Known Allergies  No known allergies.  Diagnoses:    ICD-10-CM   1. Generalized anxiety disorder  F41.1      Treatment goal plan of care: Worked with patient collaboratively on her treatment plan.  Her goals will remain on treatment plan as patient works with strategies in sessions and outside of sessions to meet her goals.  Progress is assessed each session and documented in treatment note. Reduce overall level,  frequency, and intensity of the anxiety so that daily functioning is not impaired. Increase understanding of beliefs and messages that produce the worry and anxiety. Identify, challenge, and replace fearful self talk with positive, realistic, and empowering self talk.  Plan of Care: This is a first appointment with this patient and today we collaboratively completed her initial evaluation and initial treatment goal plan.  Tyler Trigueros is a 57 year old. Has been married almost 8 yrs and her spouse is also in his 19's. Has 1 adult special needs brother lives in [place]. Patient is employed by [employer] and husband is an adjunct music professor and a few colleges. Reports having been in therapy on multiple previous occasions for depression. Today reports depression, really low self esteem, anxiety, denies any SI, not sleeping well on some nights, difficulty making decisions. Work is very stressful and thinks she can retire in 3-4 yrs. Coming to therapy "because I'm so anxious and I don't handle stress well and I panic". Also depressed and hard to handle stressful events. Reports feeling this way for the past  2-3 yrs (but symptoms were also problematic during time of parent's deaths) and "it comes and goes in waves". Lots of stressors:  mom and dad's illness and deaths but feels she is dealing some better with that. Work has always been stressful. Looking to vent, and be able to focus just on herself and work towards some healing. Lots of issues at work and patient likes to make everybody happy. Feels stress from every angle.Is very hard on herself and needs external validation often. Doesn't like herself ---sees her weight gain, wrinkles, and have never liked myself, and have some real issues with Aging. Worries about something "all the time". Patient motivated and states she is wanting to work on all of these stated concerns, and become less anxious.  Review of initial treatment goal plan and patient is in agreement.  Next appointment within 2 to 3 weeks.   Mathis Fare, LCSW

## 2022-11-01 ENCOUNTER — Ambulatory Visit: Payer: BC Managed Care – PPO | Admitting: Psychiatry

## 2022-11-01 DIAGNOSIS — F411 Generalized anxiety disorder: Secondary | ICD-10-CM

## 2022-11-01 NOTE — Progress Notes (Signed)
[facility] Counselor/Therapist Progress Note  Patient ID: Sara Hull, MRN: [id number],    Date: 11/01/2022  Time Spent: 55 minutes   Treatment Type: Individual Therapy  Reported Symptoms: anxiety "some better", some depression, mom had long hx of Bipolar, work very stressful, some interrupted sleep to "use bathroom", hard time turning off my brain about work and life stressors, tearfulness, difficulty making decisions, concerns about world violence and politics, history of anxiety, self-defeating at times   Mental Status Exam:  Appearance:   Casual     Behavior:  Appropriate, Sharing, and Motivated  Motor:  Normal  Speech/Language:   Clear and Coherent  Affect:  Depressed and anxiety  Mood:  depressed and anxious  Thought process:  goal directed  Thought content:    Rumination  Sensory/Perceptual disturbances:    WNL  Orientation:  oriented to person, place, time/date, situation, day of week, month of year, year, and stated date of Sept. 19, 2024  Attention:  Good  Concentration:  Good and Fair  Memory:  WNL  Fund of knowledge:   Good  Insight:    Good  Judgment:   Good  Impulse Control:  Good   Risk Assessment: Danger to Self:  No Self-injurious Behavior: No Danger to Others: No Duty to Warn:no Physical Aggression / Violence:No  Access to Firearms a concern: No  Gang Involvement:No   Subjective:  Patient today report some improvement in dealing "with my anxiety and depression" and "my work situation plays a big role in my symptoms." Anxious about her next cataract surgery and tendency to think about all the things that could go wrong. Needing to "stop looking for worst case scenarios and my dad was the same way." Talked about this specific issue more at length in session today as it heavily influences patient's thoughts, behaviors, and overall outlook in the present and into the future. Also looking at what sets me off and worked on identifying some triggers,  including her "long-time low self esteem". Worked also on much needed "work-life balance" and how to integrate more positive behaviors, limit setting, and use of more positive/encouraging self-talk as practiced in session. Towards end of session, patient shared some issues of loneliness that we will bring up again next session.   Interventions: Cognitive Behavioral Therapy and Ego-Supportive  Reduce overall level, frequency, and intensity of the anxiety so that daily functioning is not impaired. Increase understanding of beliefs and messages that produce the worry and anxiety. Identify, challenge, and replace fearful self talk with positive, realistic, and empowering self talk.   Diagnosis:   ICD-10-CM   1. Generalized anxiety disorder  F41.1      Plan: Patient in session today working on her anxiety and depression, negative thought patterns and assumptions leading her to look for "worst case scenarios." Is working with her treatment goals and making some initial progress with good motivation. She needs to continue working with goal-directed behaviors in order to move in a positive direction.  Long time self-esteem issues mostly related to "my weight and my appearance". "Used to workout a lot but have gotten out of the habit."   Negative self talk---to work further on this as we spoke about more self talk that can be encouraging versus judgmental. Wants to make everyone else happy. Feeling lots of stress. Noticing "my flaws" verus positives. Seeks external validation. Aging issues and hard to accept herself or work to make changes she desires. Has good goals but also realizes she  comes up with lots of reasons "why something won't work".  Goal review and progress/challenges noted with patient.  Next appointment within 2 to 3 weeks.   Mathis Fare, LCSW

## 2022-11-05 ENCOUNTER — Other Ambulatory Visit: Payer: Self-pay | Admitting: Radiology

## 2022-11-05 DIAGNOSIS — E890 Postprocedural hypothyroidism: Secondary | ICD-10-CM

## 2022-11-06 NOTE — Telephone Encounter (Signed)
Med refill request: levothyroxine 200mg c Last AEX: 12/21/21 Dr. Oscar La Next AEX: none scheduled Last MMG (if hormonal med) n/a Refill sent to provider for approval.

## 2022-11-14 ENCOUNTER — Ambulatory Visit: Payer: BC Managed Care – PPO | Admitting: Psychiatry

## 2022-11-14 DIAGNOSIS — F411 Generalized anxiety disorder: Secondary | ICD-10-CM

## 2022-11-14 NOTE — Progress Notes (Signed)
[facility] Counselor/Therapist Progress Note  Patient ID: Sara Hull, MRN: [id number],    Date: 11/14/2022  Time Spent:  53 minutes  Treatment Type: Individual Therapy  Reported Symptoms: anxiety, some depression, work is stressful, some interrupted sleep to use bathroom, hard time turning off my brain about work and other stressors, tearfulness, hard to make decision, concerns about world violence and politics, self-defeating   Mental Status Exam:  Appearance:   Casual     Behavior:  Appropriate, Sharing, and Motivated  Motor:  Normal  Speech/Language:   Negative  Affect:  Depressed and anxious, tearful  Mood:  anxious, depressed, and sad  Thought process:  goal directed  Thought content:    Rumination  Sensory/Perceptual disturbances:    WNL  Orientation:  oriented to person, place, time/date, situation, day of week, month of year, year, and stated date of Oct. 2, 2024  Attention:  Good  Concentration:  Good  Memory:  WNL  Fund of knowledge:   Good  Insight:    Good  Judgment:   Good  Impulse Control:  Good   Risk Assessment: Danger to Self:  No Self-injurious Behavior: No Danger to Others: No Duty to Warn:no Physical Aggression / Violence:No  Access to Firearms a concern: No  Gang Involvement:No   Subjective:   Patient in for appointment today and reporting anxiety and depression, low self esteem and always seeing herself in "bad ways and not as good as others" and work situations are a major factor in her anxiety and depression, and also having general insecurity. Tearfully talking through "not feeling very confident nor comfortable at work, relationship issues there also." Feeling incompetent but haven't been told that. "I do take Everything personally." Worked on this in session today and patient also shared ad processed several examples of work stressors that have been very upsetting for her." Shared that "I always compare myself to everybody else and  measure myself as not being as good as them." Reports this happens "all the time and has all my life". Always struggled with low self esteem and feeling that "I'm not good enough". Body image issues. "If I could love myself, that would help". "Either I'm neutral towards myself or I'm not feeling good at all about myself." Want to feel confident. 2 positives about herself she was able to state are "I am a caring person, I am nice, I am thoughtful". But can't hang onto positives about me." Significant self-esteem issues. Job environment can be quite negative with co-workers being negative and complaining, and work plays a large part in her mood. Cataract surgery went well. Looks for worst case scenarios. Identifying triggers in session today and really worked well showing good motivation, even with her sadness and struggles, including some ongoing loneliness.  Increased self-awareness regarding her thoughts, behaviors, and how she views things in the present and in the future, along with a tendency to discount herself.  Focus some before leaving today on more positive behaviors to try, setting limits, and definitely using more positive self talk with herself and others.  Knows that she has got to work at being more positive and not looking at "why strategies will not work" but rather being more willing to try them and giving herself a chance.  Interventions: Cognitive Behavioral Therapy and Ego-Supportive  Reduce overall level, frequency, and intensity of the anxiety so that daily functioning is not impaired. Increase understanding of beliefs and messages that produce the worry and anxiety.  Identify, challenge, and replace fearful self talk with positive, realistic, and empowering self talk.   Diagnosis:   ICD-10-CM   1. Generalized anxiety disorder  F41.1      Plan:  Patient working further on her negative thought patterns which lead her to look for "worst-case scenarios",, negative views, her anxiety,  self-esteem, and depression in session today.  Has made some initial progress in her motivation.  Definitely needs to continue working with goal-directed behaviors to move in a positive, healthier direction.  Goal review and progress/challenges noted with patient.  Next appointment within 2 to 3 weeks.   Mathis Fare, LCSW

## 2022-11-20 ENCOUNTER — Ambulatory Visit: Payer: BC Managed Care – PPO | Admitting: Psychiatry

## 2022-12-05 ENCOUNTER — Ambulatory Visit: Payer: BC Managed Care – PPO | Admitting: Psychiatry

## 2022-12-05 DIAGNOSIS — F411 Generalized anxiety disorder: Secondary | ICD-10-CM

## 2022-12-05 NOTE — Progress Notes (Signed)
[facility] Counselor/Therapist Progress Note  Patient ID: TRENISHA RUNCK, MRN: [id number],    Date: 12/05/2022  Time Spent: 55 minutes   Treatment Type: Individual Therapy  Reported Symptoms: Anxiety, stress at work, some depression, some interrupted sleep to use bathroom, hard time turning off my brain about working other stressors, tearfulness, difficulty in making decisions, concerns about world violence and politics, "sleep not too bad", "over-analyze things too much"    Mental Status Exam:  Appearance:   Casual and Neat     Behavior:  Appropriate, Sharing, and Motivated  Motor:  Normal  Speech/Language:   Clear and Coherent  Affect:  Depressed and anxious  Mood:  anxious and depressed  Thought process:  goal directed  Thought content:    Rumination  Sensory/Perceptual disturbances:    WNL  Orientation:  oriented to person, place, time/date, situation, day of week, month of year, year, and stated date of Oct. 23, 2024  Attention:  Good  Concentration:  Good and Fair  Memory:  WNL  Fund of knowledge:   Good  Insight:    Good and Fair  Judgment:   Good  Impulse Control:  Good   Risk Assessment: Danger to Self:  No Self-injurious Behavior: No Danger to Others: No Duty to Warn:no Physical Aggression / Violence:No  Access to Firearms a concern: No  Gang Involvement:No   Subjective:  Patient in session today and reporting anxiety, low self-esteem, depression, tending to see herself and negative ways and not being as good as others, stressed and work situations, and some general insecurity. Second-guesses herself a lot and "makes me miserable". Shares that she tends to frequently get disappointed in her decisions and feel later that I should have made a different choice other than the one I made. Lacking confidence at work, home, and relationships. Takes everything personally. Always comparing self to others critically and I'm always so hard on myself. Continues  struggle with low self esteem and "not feeling good enough." Continued body image issues. Wants to be able to love herself and not berate herself . Wants to feel more confident and not so judging. "Want to love myself but haven't been able to".  Difficulty hanging onto any positives.  Often looks for worst-case scenarios.  Does well in identifying triggers and working to improve her motivation and belief in herself.  Ongoing loneliness which we discussed more today.  Tendency to discount herself.  Working to increase self-awareness of her thoughts and how she treats herself.  Working to use more positive self talk with her.  Did seem more motivated today and maybe a little more belief in herself.  Interventions: Cognitive Behavioral Therapy and Ego-Supportive  Reduce overall level, frequency, and intensity of the anxiety so that daily functioning is not impaired. Increase understanding of beliefs and messages that produce the worry and anxiety. Identify, challenge, and replace fearful self talk with positive, realistic, and empowering self talk.  Diagnosis:   ICD-10-CM   1. Generalized anxiety disorder  F41.1      Plan:  Patient continues working with negative thought patterns and the tendency to focus on "worst-case scenarios", her negative view of herself, anxiety, and depression.  Patient has made some improvement in her motivation, is making some initial progress in therapy, and needs to continue working with goal-directed behaviors to move in a positive and healthier direction.  Goal review and progress/challenges noted with patient.  Next appointment within 2 to 3 weeks.   Gavin Pound  Crawford Givens, LCSW

## 2022-12-13 ENCOUNTER — Ambulatory Visit: Payer: BC Managed Care – PPO | Admitting: Psychiatry

## 2022-12-19 ENCOUNTER — Ambulatory Visit: Payer: BC Managed Care – PPO | Admitting: Psychiatry

## 2022-12-19 DIAGNOSIS — F411 Generalized anxiety disorder: Secondary | ICD-10-CM | POA: Diagnosis not present

## 2022-12-19 NOTE — Progress Notes (Signed)
[facility] Counselor/Therapist Progress Note  Patient ID: Sara Hull, MRN: [id number],    Date: 12/19/2022  Time Spent: 55 minutes   Treatment Type: Individual Therapy  Reported Symptoms: anxiety, stress, depression, trying to turn off my brain from over-focusing on stressors and the negatives, sleep some better, over-analyzing   Mental Status Exam:  Appearance:   Casual     Behavior:  Appropriate, Sharing, and Motivated  Motor:  Normal  Speech/Language:   Clear and Coherent  Affect:  Depressed and anxious  Mood:  anxious and depressed  Thought process:  goal directed  Thought content:    WNL  Sensory/Perceptual disturbances:    WNL  Orientation:  oriented to person, place, time/date, situation, day of week, month of year, year, and stated date of Nov. 6, 2024  Attention:  Good  Concentration:  Good  Memory:  WNL  Fund of knowledge:   Good  Insight:    Good  Judgment:   Good  Impulse Control:  Good   Risk Assessment: Danger to Self:  No Self-injurious Behavior: No Danger to Others: No Duty to Warn:no Physical Aggression / Violence:No  Access to Firearms a concern: No  Gang Involvement:No   Subjective:   Patient and for her session today reporting depression, low self-esteem, anxiety, the tendency to see herself in negative ways and being "less than" others, often stressed in work situations, second-guessing herself often which leads to her feeling worse, and has noticed feelings of general insecurity.  Lacks confidence in relationships, at work, and at home.  Compare self to others critically and takes everything personally.  Very hard on herself and struggles with low self-esteem and the feeling that "I am not good enough.  Continues to have body image issues but wants to be able to love herself and not be rate herself.  Desires to feel more confident and less self judging.  Has difficulty holding onto anything that might be positive.  Admits self judging and  looking for worst-case scenarios.  So far in therapy she has been able to identify some triggers and working to improve some of her motivation and belief in herself, but the challenge is for her to hang onto these gains.  Experiences ongoing loneliness and tendency to discount herself but not so much with others.  Is working some to increase self-awareness of all of her negative thoughts and being more aware of how and when she treats herself in negative ways.  Eventually warning and needing to interrupt those thoughts and feelings as she identifies them more in the moment rather than later.  Has tried some positive self talk. Also processing some work situations that have been quite stressful.   Interventions: Cognitive Behavioral Therapy and Ego-Supportive  Reduce overall level, frequency, and intensity of the anxiety so that daily functioning is not impaired. Increase understanding of beliefs and messages that produce the worry and anxiety. Identify, challenge, and replace fearful self talk with positive, realistic, and empowering self talk.  Diagnosis:   ICD-10-CM   1. Generalized anxiety disorder  F41.1      Plan:  Patient showing good motivation as she continues working with her focus tending to be on negative thought patterns and worst-case scenarios, negative views of herself, anxiety, and depression.  She is showing improvement in her motivation and also an early progress since she has begun therapy.  She needs to continue working with goal-directed behaviors to keep moving in a positive and healthier direction.  Review and progress/challenges noted with patient.  Next appointment within 2 weeks.   Mathis Fare, LCSW

## 2023-01-02 ENCOUNTER — Ambulatory Visit: Payer: BC Managed Care – PPO | Admitting: Psychiatry

## 2023-01-02 DIAGNOSIS — F411 Generalized anxiety disorder: Secondary | ICD-10-CM | POA: Diagnosis not present

## 2023-01-02 NOTE — Progress Notes (Signed)
[facility] Counselor/Therapist Progress Note  Patient ID: Sara Hull, MRN: [id number],    Date: 01/02/2023  Time Spent: 53 minutes   Treatment Type: Individual Therapy  Reported Symptoms: anxiety, stress, depression, try to turn my brain off from over-focusing on stressors and the negatives, over-analyzing, low self esteem   Mental Status Exam:  Appearance:   Casual and Neat     Behavior:  Appropriate, Sharing, and Motivated  Motor:  Normal  Speech/Language:   Clear and Coherent  Affect:  Depressed and anxious  Mood:  anxious and depressed  Thought process:  goal directed  Thought content:    Rumination  Sensory/Perceptual disturbances:    WNL  Orientation:  oriented to person, place, time/date, situation, day of week, month of year, year, and stated date of Nov. 20, 2024  Attention:  Good  Concentration:  Good  Memory:  WNL  Fund of knowledge:   Good  Insight:    Good  Judgment:   Good  Impulse Control:  Good   Risk Assessment: Danger to Self:  No Self-injurious Behavior: No Danger to Others: No Duty to Warn:no Physical Aggression / Violence:No  Access to Firearms a concern: No  Gang Involvement:No   Subjective:  Patient in for session and reporting anxiety, depression, low self esteem, focusing on negatives, over-analyzing. Sees herself negatively.  Wants a game plan for situation tomorrow with staff--my self esteem and confidence at work is challenged and some days are better than others. Work often quite stressful. Questions her clinical judgment at time although I think it is actually pretty good most of the time. When seeing things differently from others, I tend to doubt myself and think maybe I'm wrong. Talked through this in session today and worked more on her second-guessing of herself, and in some cases her self-judging. Feeling "less than" others at times. Insecurity issues addressed especially with her work. Hard on herself and struggles with "not  feeling good enough". "If someone says something negative about me, I reinforce it with my own self-criticalness; it's just hard to stop it. Worked on this further today and patient seemed to have more insight after working on it. Lacking some confidence at work, home, and in some relationships; Also compares self to others in critical ways. Hard on herself. Easily takes things personally that aren't meant that way. Body image issues continue and affect patient being able to love herself. Wants to be less judging and more self-confident. Self-judging but wants to be able to hold onto positives. Looking at previously identified triggers. Wants to improve belief in herself and increase self-awareness of negative thoughts and some of the ways she negatively treats herself. Loneliness. Discounts herself. Needing more positive self-talk.  Interventions: Cognitive Behavioral Therapy and Ego-Supportive  Reduce overall level, frequency, and intensity of the anxiety so that daily functioning is not impaired. Increase understanding of beliefs and messages that produce the worry and anxiety. Identify, challenge, and replace fearful self talk with positive, realistic, and empowering self talk.   Diagnosis:   ICD-10-CM   1. Generalized anxiety disorder  F41.1      Plan:   Patient in today working more on her treatment goals and strategies. Difficult work and personal situations and patient showing good motivation as she continues working with her focus tending to be on symptoms noted above including negative thought patterns and worst-case scenarios, managing conflict more effectively, negative views of herself, anxiety, and depression. She is showing improvement in her  motivation and also an early progress since she has begun therapy. She needs to continue working with goal-directed behaviors to keep moving in a positive and healthier direction.   Goal review and progress/challenges noted with patient.  Next  appt within 2 weeks.   Mathis Fare, LCSW

## 2023-01-15 ENCOUNTER — Ambulatory Visit: Payer: BC Managed Care – PPO | Admitting: Psychiatry

## 2023-01-16 ENCOUNTER — Ambulatory Visit: Payer: BC Managed Care – PPO | Admitting: Psychiatry

## 2023-01-30 ENCOUNTER — Ambulatory Visit: Payer: BC Managed Care – PPO | Admitting: Psychiatry

## 2023-01-30 DIAGNOSIS — F411 Generalized anxiety disorder: Secondary | ICD-10-CM | POA: Diagnosis not present

## 2023-01-30 NOTE — Progress Notes (Signed)
      [facility] Counselor/Therapist Progress Note  Patient ID: Sara Hull, MRN: [id number],    Date: 01/30/2023  Time Spent: 55 minutes   Treatment Type: Individual Therapy  Reported Symptoms: anxiety, stressed, depression improved some, over-analyzing definitely better, low self-esteem    Mental Status Exam:  Appearance:   Casual and Neat     Behavior:  Appropriate, Sharing, and Motivated  Motor:  Normal  Speech/Language:   Clear and Coherent  Affect:  Depressed and anxious  Mood:  anxious and depressed  Thought process:  goal directed  Thought content:    Rumination and it has decreased some  Sensory/Perceptual disturbances:    WNL  Orientation:  oriented to person, place, time/date, situation, day of week, month of year, year, and stated date of Dec. 18 ,2024  Attention:  Good  Concentration:  Good  Memory:  WNL  Fund of knowledge:   Good  Insight:    Good  Judgment:   Good  Impulse Control:  Good   Risk Assessment: Danger to Self:  No Self-injurious Behavior: No Danger to Others: No Duty to Warn:no Physical Aggression / Violence:No  Access to Firearms a concern: No  Gang Involvement:No   Subjective: Patient today in session and reports anxiety, low self-esteem, focused on negatives, depression, over - analyzing. Sleep is better and is on a better schedule now which has helped. Tends to see herself negatively. Trying to be more mindful of the positives each day and not dwell on negatives and self-blame. Tries to capitalize on positives. Self-esteem and confidence still challenged and discussed this more in session today. Self-doubt, and feeling "less than" focused on in session also. Shared some clear examples of the problem situations she is experiencing and how this triggers her self-negating/self- tendencies. Working to not feel so inferior to her collegues. Working also to try and not second-guess herself and this is a challenge as she has second-guessed  herself for a long time. Tends to reinforce self-criticalness, she admits. Continues body image issues, self-judging, some loneliness, discounts self.   Interventions: Cognitive Behavioral Therapy and Ego-Supportive Reduce overall level, frequency, and intensity of the anxiety so that daily functioning is not impaired. Increase understanding of beliefs and messages that produce the worry and anxiety. Identify, challenge, and replace fearful self talk with positive, realistic, and empowering self talk.  Diagnosis:   ICD-10-CM   1. Generalized anxiety disorder  F41.1      Plan:   Patient today working further on her anxiety, depression, low self-esteem, focusing on negatives, over analyzing, and some situational issues recently.  Is working well in sessions and is making some progress.  Good motivation today.  Trying to reduce negative views of herself.  Needs to continue working with goal-directed behaviors to keep moving in a more positive and healthier direction.  Review and progress/challenges noted with patient.  Next appointment within 2 weeks.   Mathis Fare, LCSW

## 2023-01-31 ENCOUNTER — Other Ambulatory Visit: Payer: Self-pay | Admitting: Obstetrics and Gynecology

## 2023-01-31 DIAGNOSIS — E890 Postprocedural hypothyroidism: Secondary | ICD-10-CM

## 2023-01-31 NOTE — Telephone Encounter (Signed)
Sent msg to front to schedule pt for AEX for further refills

## 2023-01-31 NOTE — Telephone Encounter (Signed)
Needs appt for AEX scheduled

## 2023-01-31 NOTE — Telephone Encounter (Signed)
Med refill request: Levothyroxine Last AEX: 12/21/21 Next AEX: n/a Last MMG (if hormonal med)n/a Refill authorized: Please Advise, pt has not scheduled AEX

## 2023-02-01 NOTE — Telephone Encounter (Signed)
Vella Redhead D, CMA LVM for pt to call back and schedule her AEX

## 2023-02-21 ENCOUNTER — Ambulatory Visit (INDEPENDENT_AMBULATORY_CARE_PROVIDER_SITE_OTHER): Payer: 59 | Admitting: Obstetrics and Gynecology

## 2023-02-21 ENCOUNTER — Other Ambulatory Visit (HOSPITAL_COMMUNITY)
Admission: RE | Admit: 2023-02-21 | Discharge: 2023-02-21 | Disposition: A | Discharge status: Home / Self Care | Payer: 59 | Source: Ambulatory Visit | Attending: Obstetrics and Gynecology | Admitting: Obstetrics and Gynecology

## 2023-02-21 ENCOUNTER — Encounter: Payer: Self-pay | Admitting: Obstetrics and Gynecology

## 2023-02-21 VITALS — BP 138/80 | HR 78 | Ht 70.47 in | Wt 190.0 lb

## 2023-02-21 DIAGNOSIS — Z01419 Encounter for gynecological examination (general) (routine) without abnormal findings: Secondary | ICD-10-CM | POA: Insufficient documentation

## 2023-02-21 DIAGNOSIS — Z124 Encounter for screening for malignant neoplasm of cervix: Secondary | ICD-10-CM | POA: Diagnosis present

## 2023-02-21 DIAGNOSIS — N393 Stress incontinence (female) (male): Secondary | ICD-10-CM

## 2023-02-21 DIAGNOSIS — R350 Frequency of micturition: Secondary | ICD-10-CM

## 2023-02-21 LAB — URINALYSIS, COMPLETE W/RFL CULTURE
Bacteria, UA: NONE SEEN /[HPF]
Bilirubin Urine: NEGATIVE
Glucose, UA: NEGATIVE
Hgb urine dipstick: NEGATIVE
Hyaline Cast: NONE SEEN /[LPF]
Ketones, ur: NEGATIVE
Leukocyte Esterase: NEGATIVE
Nitrites, Initial: NEGATIVE
Protein, ur: NEGATIVE
RBC / HPF: NONE SEEN /[HPF] (ref 0–2)
Specific Gravity, Urine: 1.015 (ref 1.001–1.035)
WBC, UA: NONE SEEN /[HPF] (ref 0–5)
pH: 5.5 (ref 5.0–8.0)

## 2023-02-21 LAB — NO CULTURE INDICATED

## 2023-02-21 NOTE — Assessment & Plan Note (Signed)
 Cervical cancer screening performed according to ASCCP guidelines. Encouraged annual mammogram screening Colonoscopy UTD DXA UTD Labs and immunizations with her primary Encouraged safe sexual practices as indicated Encouraged healthy lifestyle practices with diet and exercise For patients under 50-59yo, I recommend 1200mg  calcium daily and 600IU of vitamin D daily.

## 2023-02-21 NOTE — Patient Instructions (Signed)
 For patients under 50-59yo, I recommend 1200mg  calcium  daily and 600IU of vitamin D daily. For patients over 59yo, I recommend 1200mg  calcium  daily and 800IU of vitamin D daily.  Health Maintenance, Female Adopting a healthy lifestyle and getting preventive care are important in promoting health and wellness. Ask your health care provider about: The right schedule for you to have regular tests and exams. Things you can do on your own to prevent diseases and keep yourself healthy. What should I know about diet, weight, and exercise? Eat a healthy diet  Eat a diet that includes plenty of vegetables, fruits, low-fat dairy products, and lean protein. Do not eat a lot of foods that are high in solid fats, added sugars, or sodium. Maintain a healthy weight Body mass index (BMI) is used to identify weight problems. It estimates body fat based on height and weight. Your health care provider can help determine your BMI and help you achieve or maintain a healthy weight. Get regular exercise Get regular exercise. This is one of the most important things you can do for your health. Most adults should: Exercise for at least 150 minutes each week. The exercise should increase your heart rate and make you sweat (moderate-intensity exercise). Do strengthening exercises at least twice a week. This is in addition to the moderate-intensity exercise. Spend less time sitting. Even light physical activity can be beneficial. Watch cholesterol and blood lipids Have your blood tested for lipids and cholesterol at 59 years of age, then have this test every 5 years. Have your cholesterol levels checked more often if: Your lipid or cholesterol levels are high. You are older than 59 years of age. You are at high risk for heart disease. What should I know about cancer screening? Depending on your health history and family history, you may need to have cancer screening at various ages. This may include screening  for: Breast cancer. Cervical cancer. Colorectal cancer. Skin cancer. Lung cancer. What should I know about heart disease, diabetes, and high blood pressure? Blood pressure and heart disease High blood pressure causes heart disease and increases the risk of stroke. This is more likely to develop in people who have high blood pressure readings or are overweight. Have your blood pressure checked: Every 3-5 years if you are 25-57 years of age. Every year if you are 24 years old or older. Diabetes Have regular diabetes screenings. This checks your fasting blood sugar level. Have the screening done: Once every three years after age 62 if you are at a normal weight and have a low risk for diabetes. More often and at a younger age if you are overweight or have a high risk for diabetes. What should I know about preventing infection? Hepatitis B If you have a higher risk for hepatitis B, you should be screened for this virus. Talk with your health care provider to find out if you are at risk for hepatitis B infection. Hepatitis C Testing is recommended for: Everyone born from [birth year] through 1966. Anyone with known risk factors for hepatitis C. Sexually transmitted infections (STIs) Get screened for STIs, including gonorrhea and chlamydia, if: You are sexually active and are younger than 59 years of age. You are older than 59 years of age and your health care provider tells you that you are at risk for this type of infection. Your sexual activity has changed since you were last screened, and you are at increased risk for chlamydia or gonorrhea. Ask your health care provider if  you are at risk. Ask your health care provider about whether you are at high risk for HIV. Your health care provider may recommend a prescription medicine to help prevent HIV infection. If you choose to take medicine to prevent HIV, you should first get tested for HIV. You should then be tested every 3 months for as long as you  are taking the medicine. Osteoporosis and menopause Osteoporosis is a disease in which the bones lose minerals and strength with aging. This can result in bone fractures. If you are 72 years old or older, or if you are at risk for osteoporosis and fractures, ask your health care provider if you should: Be screened for bone loss. Take a calcium  or vitamin D supplement to lower your risk of fractures. Be given hormone replacement therapy (HRT) to treat symptoms of menopause. Follow these instructions at home: Alcohol use Do not drink alcohol if: Your health care provider tells you not to drink. You are pregnant, may be pregnant, or are planning to become pregnant. If you drink alcohol: Limit how much you have to: 0-1 drink a day. Know how much alcohol is in your drink. In the [place] one drink equals one 12 oz bottle of beer (355 mL), one 5 oz glass of wine (148 mL), or one 1 oz glass of hard liquor (44 mL). Lifestyle Do not use any products that contain nicotine or tobacco. These products include cigarettes, chewing tobacco, and vaping devices, such as e-cigarettes. If you need help quitting, ask your health care provider. Do not use street drugs. Do not share needles. Ask your health care provider for help if you need support or information about quitting drugs. General instructions Schedule regular health, dental, and eye exams. Stay current with your vaccines. Tell your health care provider if: You often feel depressed. You have ever been abused or do not feel safe at home. Summary Adopting a healthy lifestyle and getting preventive care are important in promoting health and wellness. Follow your health care provider's instructions about healthy diet, exercising, and getting tested or screened for diseases. Follow your health care provider's instructions on monitoring your cholesterol and blood pressure. This information is not intended to replace advice given to you by your health  care provider. Make sure you discuss any questions you have with your health care provider. Document Revised: 06/20/2020 Document Reviewed: 06/20/2020 Elsevier Patient Education  2024 [employer].

## 2023-02-21 NOTE — Assessment & Plan Note (Addendum)
 Reviewed treatment options including weight loss, Kegels and pelvic floor therapy, pessaries, and surgical management.  We also discussed limiting bladder irritants and importance of regular bladder emptying.

## 2023-02-21 NOTE — Progress Notes (Signed)
 58 y.o. G0P0000 postmenopausal female here for annual exam. Married.  Pediatric speech pathologist.  Pt reports urinary frequency, some leakage with laughter; vaginal odor with urine smell at times.  Stopped POP last year with elevated FSH.  Denies significant menopausal symptoms.  No LMP recorded. Patient has had an ablation.  Postmenopausal bleeding: none Pelvic discharge or pain: none Breast mass, nipple discharge or skin changes : none Last PAP:     Component Value Date/Time   DIAGPAP  10/29/2019 1603    - Negative for intraepithelial lesion or malignancy (NILM)   HPVHIGH Negative 10/29/2019 1603   ADEQPAP  10/29/2019 1603    Satisfactory for evaluation; transformation zone component PRESENT.   Last mammogram: 04/26/2022 BI-RADS 1, density C Last colonoscopy: [id number] Last DXA: 11/17/18, normal, done for incidental finding of compression fracture along spine Sexually active: Yes Exercising: Yes, Pilates occasionally  GYN HISTORY: No significant history  OB History  Gravida Para Term Preterm AB Living  0 0 0 0 0 0  SAB IAB Ectopic Multiple Live Births  0 0 0 0 0    Past Medical History:  Diagnosis Date   Allergic rhinitis    Dysthymia    Hypothyroidism    Rosacea     Past Surgical History:  Procedure Laterality Date   HYSTEROSCOPY  04/2015   WITH ABLATION   HYSTEROSCOPY WITH D & C N/A 09/24/2014   Procedure: DILATATION AND CURETTAGE /HYSTEROSCOPY;  Surgeon: Jolene Gaskins, MD;  Location: [facility];  Service: Gynecology;  Laterality: N/A;   WISDOM TOOTH EXTRACTION      Current Outpatient Medications on File Prior to Visit  Medication Sig Dispense Refill   [employer] SUNSCREEN SPF 30 EX apply topically to face and body daily for 30     fluticasone  (FLONASE ) 50 MCG/ACT nasal spray 2 SPRAYS BOTH NOSTRILS DAILY 16 g 4   levothyroxine  (SYNTHROID ) 200 MCG tablet TAKE ONE TABLET BY MOUTH DAILY BEFORE BREAKFAST 30 tablet 0   Loratadine 10 MG CAPS Claritin     sertraline   (ZOLOFT ) 100 MG tablet TAKE 1.5 TABLETS (150MG  TOTAL) BY MOUTH DAILY 135 tablet 3   No current facility-administered medications on file prior to visit.    Social History   Socioeconomic History   Marital status: Married    Spouse name: Not on file   Number of children: Not on file   Years of education: Not on file   Highest education level: Master's degree (e.g., MA, MS, MEng, MEd, MSW, MBA)  Occupational History   Occupation: SPEECH LANGUAGE PATHOLOGIST    Employer: [employer]  Tobacco Use   Smoking status: Never   Smokeless tobacco: Never  Vaping Use   Vaping status: Never Used  Substance and Sexual Activity   Alcohol use: Yes    Alcohol/week: 4.0 standard drinks of alcohol    Types: 4 Standard drinks or equivalent per week   Drug use: No   Sexual activity: Yes    Comment: Ablation  Other Topics Concern   Not on file  Social History Narrative   Married late in life. She nor husband had been married before and neither have kids.   Moved a lot as a kid, Dad was an Psychologist, Educational in [employer] and he moved places within [employer]. In 11th grade, they moved to [place] and that's where their home base is now. Armilda has been in [place] for 25 years now. Mom did different jobs, had a hard time  keeping them d/t mental health.      No legal.   Religion-Christian. Grew up [employer].   Caffeine- 3 cups of coffee/d.  Sometimes in afternoon will have Starbucks coffee   Social Drivers of Health   Financial Resource Strain: Low Risk  (06/08/2020)   Overall Financial Resource Strain (CARDIA)    Difficulty of Paying Living Expenses: Not hard at all  Food Insecurity: No Food Insecurity (01/16/2022)   [facility] prior to 04/14/[facility] visits prior to 04/14/2022[facility]   Hunger Vital Sign    Worried About Running Out of Food in the Last Year: Never true    Ran Out  of Food in the Last Year: Never true  Transportation Needs: No Transportation Needs (01/16/2022)   [facility] prior to 04/14/[facility]22[facility], [facility] visits prior to 04/14/2022., [facility]   PRAPARE - Transportation    Lack of Transportation (Medical): No    Lack of Transportation (Non-Medical): No  Physical Activity: Insufficiently Active (06/08/2020)   Exercise Vital Sign    Days of Exercise per Week: 6 days    Minutes of Exercise per Session: 20 min  Stress: Stress Concern Present (06/08/2020)   [employer] of Occupational Health - Occupational Stress Questionnaire    Feeling of Stress : Very much  Social Connections: Moderately Isolated (06/08/2020)   Social Connection and Isolation Panel [NHANES]    Frequency of Communication with Friends and Family: Three times a week    Frequency of Social Gatherings with Friends and Family: Three times a week    Attends Religious Services: Never    Active Member of Clubs or Organizations: No    Attends Banker Meetings: Never    Marital Status: Married  Catering Manager Violence: Not on file    Family History  Problem Relation Age of Onset   Other Mother        nectrotizing of the colon-had ileostomy that has been reversed   Bipolar disorder Mother    Schizophrenia Mother    Dementia Mother    Heart disease Mother    Hyperlipidemia Father    Colon polyps Father    Cancer Father        bile duct cancer   Mental illness Brother        undefined, unable to live independently   Alcohol abuse Brother    Drug abuse Brother    Depression Maternal Grandmother    Cancer Paternal Grandfather    Colon cancer Neg Hx    Esophageal cancer Neg Hx    Rectal cancer Neg Hx    Stomach cancer Neg Hx     No Known Allergies    PE Today's Vitals   02/21/23 1532  BP: 138/80  Pulse: 78  SpO2: 98%  Weight: 190 lb (86.2 kg)  Height: 5' 10.47 (1.79 m)    Body mass index is 26.9 kg/m.  Physical Exam Vitals reviewed. Exam conducted with a chaperone present.  Constitutional:      General: She is not in acute distress.    Appearance: Normal appearance.  HENT:     Head: Normocephalic and atraumatic.     Nose: Nose normal.  Eyes:     Extraocular Movements: Extraocular movements intact.     Conjunctiva/sclera: Conjunctivae normal.  Neck:     Thyroid : No thyroid  mass, thyromegaly or thyroid  tenderness.  Pulmonary:     Effort: Pulmonary effort is normal.  Chest:     Chest wall: No mass or tenderness.  Breasts:    Right: Normal. No swelling, mass, nipple discharge, skin change or tenderness.     Left: Normal. No swelling, mass, nipple discharge, skin change or tenderness.  Abdominal:     General: There is no distension.     Palpations: Abdomen is soft.     Tenderness: There is no abdominal tenderness.  Genitourinary:    General: Normal vulva.     Exam position: Lithotomy position.     Urethra: No prolapse.     Vagina: Normal. No vaginal discharge or bleeding.     Cervix: Normal. No lesion.     Uterus: Normal. Not enlarged and not tender.      Adnexa: Right adnexa normal and left adnexa normal.     Comments: Kegel strength 4/ 5 Musculoskeletal:        General: Normal range of motion.     Cervical back: Normal range of motion.  Lymphadenopathy:     Upper Body:     Right upper body: No axillary adenopathy.     Left upper body: No axillary adenopathy.     Lower Body: No right inguinal adenopathy. No left inguinal adenopathy.  Skin:    General: Skin is warm and dry.  Neurological:     General: No focal deficit present.     Mental Status: She is alert.  Psychiatric:        Mood and Affect: Mood normal.        Behavior: Behavior normal.       Assessment and Plan:        Well woman exam with routine gynecological exam Assessment & Plan: Cervical cancer screening performed according to ASCCP guidelines. Encouraged annual  mammogram screening Colonoscopy UTD DXA UTD Labs and immunizations with her primary Encouraged safe sexual practices as indicated Encouraged healthy lifestyle practices with diet and exercise For patients under 50-70yo, I recommend 1200mg  calcium daily and 600IU of vitamin D  daily.    Urinary frequency -     Urinalysis,Complete w/RFL Culture  Cervical cancer screening -     Cytology - PAP  SUI (stress urinary incontinence, female) Assessment & Plan: Reviewed treatment options including weight loss, Kegels and pelvic floor therapy, pessaries, and surgical management.  We also discussed limiting bladder irritants and importance of regular bladder emptying.   Vera LULLA Pa, MD

## 2023-02-26 ENCOUNTER — Ambulatory Visit: Payer: 59 | Admitting: Psychiatry

## 2023-02-26 DIAGNOSIS — F411 Generalized anxiety disorder: Secondary | ICD-10-CM

## 2023-02-26 LAB — CYTOLOGY - PAP
Comment: NEGATIVE
Diagnosis: NEGATIVE
High risk HPV: NEGATIVE

## 2023-02-26 NOTE — Progress Notes (Signed)
 [facility] Counselor/Therapist Progress Note  Patient ID: Sara Hull, MRN: [id number],    Date: 02/26/2023  Time Spent: 53 minutes   Treatment Type: Individual Therapy  Reported Symptoms: anxiety, depression, stressed (some better), over-analyzing, low self-esteem    Mental Status Exam:  Appearance:   Casual and Neat     Behavior:  Sharing and Motivated  Motor:  Normal  Speech/Language:   Clear and Coherent  Affect:  Depressed and anxious  Mood:  anxious and depressed  Thought process:  goal directed  Thought content:    Rumination  Sensory/Perceptual disturbances:    WNL  Orientation:  oriented to person, place, time/date, situation, day of week, month of year, year, and stated date of Jan. 14, 2025  Attention:  Good  Concentration:  Good  Memory:  WNL  Fund of knowledge:   Good  Insight:    Good  Judgment:   Good  Impulse Control:  Good   Risk Assessment: Danger to Self:  No Self-injurious Behavior: No Danger to Others: No Duty to Warn:no Physical Aggression / Violence:No  Access to Firearms a concern: No  Gang Involvement:No   Subjective:   Patient in session today and reporting symptoms of low self-esteem, anxiety, depression, over -analyzing, and a tendency to focus more on the negatives versus positives. Feeling more down, and it sometimes comes in waves. Feels lonely. My husband is great. But don't have many friends. Does have a friend locally and we could be better friends if we get our times coordinated better. Used to feel better about myself and took better care of myself, but it has decreased some and my prior group of friends dwindled some and have grown apart some. Feeling worse about herself, partly due to gaining some weight and not exercising as much.  Lots of self-doubt, looking at the negatives, what might go wrong versus right, and seeing all sorts of obstacles which we did address in session.  Hard to be motivated and discussed this with  patient, also helping her to see possible options rather than feeling negative about options mentioned. Encouraged patient to be less self-judging, and refrain from looking for opportunities that might help her.  Discussed some specific plan she could make an supports along the way to help her stay on track and trying to develop new habits that are healthier.  Interventions: Cognitive Behavioral Therapy and Ego-Supportive  Reduce overall level, frequency, and intensity of the anxiety so that daily functioning is not impaired. Increase understanding of beliefs and messages that produce the worry and anxiety. Identify, challenge, and replace fearful self talk with positive, realistic, and empowering self talk.   Diagnosis:   ICD-10-CM   1. Generalized anxiety disorder  F41.1      Plan:   Patient in today and focusing more on her anxiety, low self-esteem, depression, tending to focus more on negatives, over analyzing, and some situational issues.  Needs to continue working on reduction of negative ways of viewing herself.  Patient does work consistently in sessions and has shown good motivation and progress but today is having a harder time feeling is motivated.  She needs to continue working with her goal-directed behaviors so as to keep moving in a more positive and healthier direction.  Looked at some steps she could take that feel a little more do-able to her, and would be some initial positive steps that could hopefully lead her to continue with more positive steps.  Did seem to  grasp this idea and agrees to work on this between sessions.  Goal review and progress/challenges noted with patient.  Next appointment within 2 weeks.   Barnie Bunde, LCSW

## 2023-03-12 ENCOUNTER — Ambulatory Visit: Payer: BC Managed Care – PPO | Admitting: Psychiatry

## 2023-03-12 DIAGNOSIS — F411 Generalized anxiety disorder: Secondary | ICD-10-CM | POA: Diagnosis not present

## 2023-03-12 NOTE — Progress Notes (Signed)
[facility] Counselor/Therapist Progress Note  Patient ID: Sara Hull, MRN: [id number],    Date: 03/12/2023  Time Spent: 55 minutes   Treatment Type: Individual Therapy  Reported Symptoms: anxiety, depression , stressed, over-analyzing, low self-esteem   Mental Status Exam:  Appearance:   Casual and Neat     Behavior:  Appropriate, Sharing, and Motivated  Motor:  Normal  Speech/Language:   Clear and Coherent  Affect:  Depressed and anxious  Mood:  anxious and depressed  Thought process:  goal directed  Thought content:    Rumination  Sensory/Perceptual disturbances:    WNL  Orientation:  oriented to person, place, time/date, situation, day of week, month of year, year, and stated date of Jan. 28, 2025  Attention:  Good  Concentration:  Good  Memory:  WNL  Fund of knowledge:   Good  Insight:    Good and Fair  Judgment:   Good  Impulse Control:  Good   Risk Assessment: Danger to Self:  No Self-injurious Behavior: No Danger to Others: No Duty to Warn:no Physical Aggression / Violence:No  Access to Firearms a concern: No  Gang Involvement:No   Subjective:   Patient today in session and reporting symptoms to be anxiety, low self-esteem, depression, over analyzing, and her tendency to focus more on the negatives versus positives. Very upset over "changes in the whole country since Presidential shift in personnel occurred recently". Very upset about many of the abrupt sudden changes new President has made. Talked through a lot of personal info that helped explain how she grew up, values that were important and continued explaining her journey into adulthood and married life and how some of her more important needs are not getting met. Worked with patient as she prioritized her needs and how to go about getting them met, especially regarding her physical, emotional, and "religious/spiritual" health. States she will start some exercise this week and maintain at least twice  per week and note the physical and mental benefit and be able to share this next session.  Also discussed ways she might boost her motivation some to help as she get started with some new, healthier habits.  Urged patient to let go of the negative self talk, the negative view she has of herself, self judging, and looking more for "what might go right versus wrong", stopping the self-doubt, and working hard to be more motivated in pursuit of her goals.  Husband very supportive.  Interventions: Cognitive Behavioral Therapy and Ego-Supportive  Reduce overall level, frequency, and intensity of the anxiety so that daily functioning is not impaired. Increase understanding of beliefs and messages that produce the worry and anxiety. Identify, challenge, and replace fearful self talk with positive, realistic, and empowering self talk.  Diagnosis:   ICD-10-CM   1. Generalized anxiety disorder  F41.1      Plan:  Patient today in session and focusing further on her low self-esteem, anxiety, depression, tendency to give more power to the negatives versus positives, over analyzing, and some difficult situational issues.  Focusing more today on her need to reduce the negative ways of viewing herself, and again looked at some ways that she could work on that and feel motivated to do so, including some initial positive steps that might help her to feel more hopeful. Patient shows good efforts although struggles with motivation at times, as she works with goal-directed behaviors in and outside of sessions.  She has shown progress and needs to continue  the work with her goals so as to move forward in a more hopeful and healthier direction.   Goal review and progress/challenges noted with patient.  Next appointment within 2 weeks.   Mathis Fare, LCSW

## 2023-03-26 ENCOUNTER — Ambulatory Visit: Payer: 59 | Admitting: Psychiatry

## 2023-03-26 ENCOUNTER — Other Ambulatory Visit: Payer: Self-pay | Admitting: Radiology

## 2023-03-26 DIAGNOSIS — F411 Generalized anxiety disorder: Secondary | ICD-10-CM | POA: Diagnosis not present

## 2023-03-26 DIAGNOSIS — E890 Postprocedural hypothyroidism: Secondary | ICD-10-CM

## 2023-03-26 NOTE — Progress Notes (Signed)
[facility] Counselor/Therapist Progress Note  Patient ID: Sara Hull, MRN: [id number],    Date: 03/26/2023  Time Spent: 53 minutes   Treatment Type: Individual Therapy  Reported Symptoms: anxiety, depression, stressed, over-analyzing improving some, low self-esteem    Mental Status Exam:  Appearance:   Casual and Neat     Behavior:  Appropriate, Sharing, and Motivated  Motor:  Normal  Speech/Language:   Clear and Coherent  Affect:  Anxious/depressed  Mood:  anxious  Thought process:  goal directed  Thought content:    WNL  Sensory/Perceptual disturbances:    WNL  Orientation:  oriented to person, place, time/date, situation, day of week, month of year, year, and stated date of Feb. 11, 2025  Attention:  Good  Concentration:  Good  Memory:  WNL  Fund of knowledge:   Good  Insight:    Good  Judgment:   Good  Impulse Control:  Good   Risk Assessment: Danger to Self:  No Self-injurious Behavior: No Danger to Others: No Duty to Warn:no Physical Aggression / Violence:No  Access to Firearms a concern: No  Gang Involvement:No   Subjective:     Patient in session today and reporting symptoms of low self-esteem, depression, over analyzing, judging herself, anxiety, and her tendency to focus more on the negatives versus positives.  Remains upset and concerned about the [place] and impact of new President.  Shared some of her views initially and then was able to shift her focus more to herself and her treatment goals.  Reflected some on information she shared last session regarding how she grew up, some of the values that were important within her family, and helped explain her transition into adulthood and married life and how some of her important needs did not get met.  Able to relate some of her history to her treatment goals currently and discussed being able to pursue some conversation and work on this in our sessions.  Issues shared related to her  physical/emotional/religious/spiritual health.  Checked in with patient on some of the initial changes she was going to try since her last appointment including starting some exercise that week x 2 weekly and noting any benefits that she sets in her physical and/or mental health, although acknowledging this would only be for short period of time, and that if she keeps up some positive changes then eventually over a longer time span, she could likely notice more benefit which we agreed would be a good goal for patient.   Did make 1 trip to [employer] [employer] yesterday with husband and they committed to go 2 times weekly for now. "It did feel good while I was doing the exercises." Stressing over social events and struggles with assuming the negatives. Discussed motivational challenges and looked at what might help boost her motivation.  Reviewed some more recent negative self talk, negative view of herself, self judging, self-doubt.  Worked with patient on trying to shift to looking more for "what might go right versus wrong" and the ability to also make a shift in her view of herself especially as she works on decreasing self-doubt and on other difficult behaviors to change.  States "I have been better about giving myself a little grace re: things at work and getting onto myself." Patient continues to report that husband is very supportive and I encouraged her to let him know how he can best support her as she works on these critical changes for herself.  Interventions: Cognitive Behavioral Therapy and Ego-Supportive  Reduce overall level, frequency, and intensity of the anxiety so that daily functioning is not impaired. Increase understanding of beliefs and messages that produce the worry and anxiety. Identify, challenge, and replace fearful self talk with positive, realistic, and empowering self talk.  Diagnosis:   ICD-10-CM   1. Generalized anxiety disorder  F41.1      Plan:   Patient working today in  session on her symptoms of anxiety, depression, low self-esteem, over analyzing, situational issues at work and outside of work, and her tendency to give more power to the negatives versus positives.  Worked more with patient today to help her understand her negative ways of viewing herself and difficulty in changing these negative views, and how this impacts her above stated symptoms.  Has been feeling powerless over her low self-esteem and self negating. Continued work today on trying to reduce her negative ways of viewing herself, see some areas in her life that she could improve and would help her feel more positive and motivated, as well as positive ways of thinking and behaving that would help patient feel more hopeful.  She does show good efforts in session however it is difficult for her to maintain this as the negative thoughts patterns and self judgment emerge even as she talks in session.  Patient has shown some initial progress, even to make the decision to come in for therapy, and definitely needs to continue working with her goal-directed behaviors so as to move in a more hopeful and healthier direction for herself.  Goal review and progress/challenges noted with patient.  Next appointment within 2 weeks.   Mathis Fare, LCSW

## 2023-03-26 NOTE — Telephone Encounter (Signed)
Med refill request:Levothyroxine tablet  Last AEX: 02/21/23 Next AEX: not scheduled  Last MMG (if hormonal med) 04/26/22 birads cat 1 neg  Refill authorized: Last rx 01/31/23 #30 with 0 refills. Patient requesting #30 with 1 refill. Please approve or deny as appropriate.

## 2023-04-09 ENCOUNTER — Ambulatory Visit: Payer: BC Managed Care – PPO | Admitting: Psychiatry

## 2023-04-09 DIAGNOSIS — F411 Generalized anxiety disorder: Secondary | ICD-10-CM | POA: Diagnosis not present

## 2023-04-09 NOTE — Progress Notes (Signed)
 [facility] Counselor/Therapist Progress Note  Patient ID: Sara Hull, MRN: [id number],    Date: 04/09/2023  Time Spent: 53 minutes   Treatment Type: Individual Therapy  Reported Symptoms: anxiety, depression, over-analyzing herself, low self esteem   Mental Status Exam:  Appearance:   Casual     Behavior:  Appropriate, Sharing, and Motivated  Motor:  Normal  Speech/Language:   Clear and Coherent  Affect:  anxious  Mood:  anxious and depressed  Thought process:  goal directed  Thought content:    WNL  Sensory/Perceptual disturbances:    WNL  Orientation:  oriented to person, place, time/date, situation, day of week, month of year, year, and stated date of Feb. 25, 2025.  Attention:  Good  Concentration:  Good and Fair  Memory:  WNL  Fund of knowledge:   Good  Insight:    Good  Judgment:   Good  Impulse Control:  Good   Risk Assessment: Danger to Self:  No Self-injurious Behavior: No Danger to Others: No Duty to Warn:no Physical Aggression / Violence:No  Access to Firearms a concern: No  Gang Involvement:No   Subjective: Today patient in for session and we agreed to spend more time today focusing on self-esteem issues, anger, frustration, fears, and shocked at how things have gone since new President took over office and is very discouraged.  Stated she needed to not dwell on this so much but can't seem to untangle from the political negativity. Stressors at work and significant negativity about herself and "how I look, and not happy with myself." But is going to an event with husband this weekend out of town and dreading it because "of how I look and I look bad." Worked with patient on self-image issues and self-negating, and especially not viewing herself in such a negative way. Needed most of session today to work on her self-esteem and body image issues, assuming what others think. Seemed to feel some strength by end of session. Also integrating her faith in  helpful ways.  Discussed some of her mood relational challenges and looked at ways of proving her motivation.  Encouraged to decrease her negative self talk, her negative view of herself, self-doubt, and self judging.  Will be working with patient on this further, along with her self-esteem issues and trying to decrease her self-doubt and negative self talk.  Did seem to be a little more motivated at the end of session.  Interventions: Cognitive Behavioral Therapy and Ego-Supportive  Reduce overall level, frequency, and intensity of the anxiety so that daily functioning is not impaired. Increase understanding of beliefs and messages that produce the worry and anxiety. Identify, challenge, and replace fearful self talk with positive, realistic, and empowering self talk.   Diagnosis:   ICD-10-CM   1. Generalized anxiety disorder  F41.1       Plan: Patient today working further on her low self-esteem, anxiety, depression, negativity, over analyzing, and situational issues both inside and outside of work.  Also working on better understanding the negative ways she views herself and how this complicates her making progress.  Shares more today about how she has felt some powerless over her low self-esteem and trying to help her help herself in this respect and eliminate more of her negative messages to and about herself.  Goal review and progress/challenges noted with patient.  Next appt within 2 weeks.   Mathis Fare, LCSW

## 2023-04-24 ENCOUNTER — Ambulatory Visit: Payer: 59 | Admitting: Psychiatry

## 2023-04-24 DIAGNOSIS — F411 Generalized anxiety disorder: Secondary | ICD-10-CM

## 2023-04-24 NOTE — Progress Notes (Signed)
 [facility] Counselor/Therapist Progress Note  Patient ID: Sara Hull, MRN: [id number],    Date: 04/24/2023  Time Spent: 53 minutes   Treatment Type: Individual Therapy  Reported Symptoms: anxiety, low self esteem, depression, overanalyzing herself (usually in negative ways)    Mental Status Exam:  Appearance:   Casual and Neat     Behavior:  Appropriate, Sharing, and Motivated  Motor:  Normal  Speech/Language:   Clear and Coherent  Affect:  Depressed and anxious  Mood:  anxious and depressed  Thought process:  goal directed  Thought content:    WNL  Sensory/Perceptual disturbances:    WNL  Orientation:  oriented to person, place, time/date, situation, day of week, month of year, year, and stated date of April 24, 2023  Attention:  Good  Concentration:  Good  Memory:  WNL  Fund of knowledge:   Good  Insight:    Good  Judgment:   Good  Impulse Control:  Good   Risk Assessment: Danger to Self:  No Self-injurious Behavior: No Danger to Others: No Duty to Warn:no Physical Aggression / Violence:No  Access to Firearms a concern: No  Gang Involvement:No   Subjective:   Patient in session today continuing to work intentionally on her self-esteem issues, frustration, fears, anger, and concerns "about the world and how shocking and discouraging things are".  States that she knows she does not need to do well on this too much but has a hard time untangling from "political negativity". Event she went to last weekend and had been very stressed and self-negating, did end up "going ok, better that thought." Processed some of her thoughts and feelings from that event and they weren't as negative as she had imagined and expected. Today, clearly needing to work on her self-esteem, and also some workplace issues patient is experiencing. Work stressors have been heavier and more frequent recently and weighing in on patient's negative feelings about herself. Needed to focus on this  more today as work issues and relationships are weighing heavily on patient and she admits this is a real struggle for her at work and personally. Continued work on self-image and trying to decrease her self-negating and not seeing herself in negative ways. Also focusing  on body image issues and self-esteem, working with specific examples with patient.Trying to be able to ground herself more in tense or anxious situations. 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  Interventions: Cognitive Behavioral Therapy and Ego-Supportive  Reduce overall level, frequency, and intensity of the anxiety so that daily functioning is not impaired. Increase understanding of beliefs and messages that produce the worry and anxiety. Identify, challenge, and replace fearful self talk with positive, realistic, and empowering self talk.  Diagnosis:   ICD-10-CM   1. Generalized anxiety disorder  F41.1      Plan:   Patient working today more on her depression, anxiety, low self-esteem, negativity, over analyzing, and situational issues inside and outside of work.  She continues to focus more on trying to better understand how she views herself in negative ways and see how this directly interrupts her making progress.  History of feeling powerless over her low self-esteem and working to eliminate her negative messages to and about herself, and replace them with more realistic and positive messages.  This patient has made progress and  needs to continue working with goal-directed behaviors in order to move in a more hopeful and healthier direction.  Goal review and progress/challenges noted with patient.  Next appointment within 2 weeks.   Mathis Fare, LCSW

## 2023-05-03 ENCOUNTER — Other Ambulatory Visit: Payer: Self-pay | Admitting: Nurse Practitioner

## 2023-05-03 DIAGNOSIS — E890 Postprocedural hypothyroidism: Secondary | ICD-10-CM

## 2023-05-03 NOTE — Telephone Encounter (Signed)
 Med refill request: levothyroxine 200 mcg Last AEX: 02/21/23 Next AEX: none scheduled Last MMG (if hormonal med n/a Last refill 03/26/23 #30 zero refills.  Refill authorized levothyroxine 200 mcg.  Will need additional refills if you are willing to prescribe. Please approve or deny as appropriate.

## 2023-05-07 ENCOUNTER — Ambulatory Visit: Payer: 59 | Admitting: Psychiatry

## 2023-05-07 DIAGNOSIS — F411 Generalized anxiety disorder: Secondary | ICD-10-CM | POA: Diagnosis not present

## 2023-05-07 NOTE — Progress Notes (Signed)
 [facility] Counselor/Therapist Progress Note  Patient ID: Sara Hull, MRN: [id number],    Date: 05/07/2023  Time Spent: 58 minutes   Treatment Type: Individual Therapy  Reported Symptoms: anxiety (some better), low self esteem (some less), depression (some less), overanalyzing herself seeing the negatives vs positives    Mental Status Exam:  Appearance:   Neat and Well Groomed     Behavior:  Appropriate, Sharing, and Motivated  Motor:  Normal  Speech/Language:   Normal Rate  Affect:  Depressed and anxiety  Mood:  anxious and depressed  Thought process:  goal directed  Thought content:    Overthinking not quite as bad  Sensory/Perceptual disturbances:    WNL  Orientation:  oriented to person, place, time/date, situation, day of week, month of year, year, and stated date of May 07, 2023  Attention:  Good  Concentration:  Good  Memory:  WNL  Fund of knowledge:   Good  Insight:    Good  Judgment:   Good  Impulse Control:  Good   Risk Assessment: Danger to Self:  No Self-injurious Behavior: No Danger to Others: No Duty to Warn:no Physical Aggression / Violence:No  Access to Firearms a concern: No  Gang Involvement:No   Subjective:    Today, patient in session focusing on her self-esteem issues, fears, anger, frustration, and concerns about "the world and how shocking and disturbing things are". I am  working on my self-esteem and trying to "hear more of the positives."Working further today on her self-esteem, negotiating at work, self-judging, comparing herself to others, sometimes on the defensive and difficulty working with others at times especially at work. Processed these together today and seemed to have some additional insight on work situations and handled the situation in more positive way. Handled some comments from 2 co-workers better and able to explain her initial reaction of surprise and misinterpreting both of them. Really making goal-directed efforts  on the job in working with co-workers especially when something trips her up emotionally. Continued work on her self-esteem in the workplace and beyond. Work stressors have been heavier recently but feels it's a good sign that she's felt some better this past week even with the stress level being high. To continue to work on her self-image and seeing herself in more positive ways. Body image continues to be a issue and patient committed in working on this.   Interventions: Cognitive Behavioral Therapy and Ego-Supportive Reduce overall level, frequency, and intensity of the anxiety so that daily functioning is not impaired. Increase understanding of beliefs and messages that produce the worry and anxiety. Identify, challenge, and replace fearful self talk with positive, realistic, and empowering self talk.  Diagnosis:   ICD-10-CM   1. Generalized anxiety disorder  F41.1      Plan:  Patient continues to work today on anxiety, low self-esteem, depression, negativity, over analyzing, and situational issues both inside and outside of work situations.  Trying to view herself in a less negative way and understand how holding onto negative thoughts interrupts her making progress.  Patient has reported that she does have a history of feeling very negative towards herself, powerlessness and definitely having low self-esteem.  Is wanting to eliminate the negative messages about herself and be able to replace them with more positive and realistic messages about herself.  Patient has made some initial progress thus far and agrees that she needs to continue working with goal-directed behaviors in order to move in a more  hopeful and healthier direction going forward.  Goal review and progress/challenges noted with patient.  Next appointment within 2 weeks.   Mathis Fare, LCSW

## 2023-05-21 ENCOUNTER — Ambulatory Visit: Payer: 59 | Admitting: Psychiatry

## 2023-05-21 DIAGNOSIS — F411 Generalized anxiety disorder: Secondary | ICD-10-CM

## 2023-05-21 NOTE — Progress Notes (Signed)
 [facility] Counselor/Therapist Progress Note  Patient ID: Sara Hull, MRN: [id number],    Date: 05/21/2023  Time Spent: 55 minutes   Treatment Type: Individual Therapy  Reported Symptoms: anxiety continues to get some better, low self esteem (some less),depression (some better), and overanalyzing herself and tends to see the negatives more than positives.    Mental Status Exam:  Appearance:   Casual and Neat     Behavior:  Appropriate and Motivated  Motor:  Normal  Speech/Language:   Clear and Coherent  Affect:  Anxious, some depression  Mood:  anxious and depressed  Thought process:  normal  Thought content:    WNL  Sensory/Perceptual disturbances:    WNL  Orientation:  oriented to person, place, time/date, situation, day of week, month of year, year, and stated date of May 21, 2023  Attention:  Good  Concentration:  Good  Memory:  WNL  Fund of knowledge:   Good  Insight:    Good  Judgment:   Good  Impulse Control:  Good   Risk Assessment: Danger to Self:  No Self-injurious Behavior: No Danger to Others: No Duty to Warn:no Physical Aggression / Violence:No  Access to Firearms a concern: No  Gang Involvement:No   Subjective:   Patient in session today focusing further on her issues of anxiety, some depression, low self-esteem, anger re: world issues with current Clinical cytogeneticist, frustration, and continued concerns about "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  Does feel that she is putting forth more effort to work on her goals and to see "more positives".  Today working more specifically on her self judging tendencies, lower self-esteem, work related issues, comparing herself to others, and having some challenges at work in her getting on the defensive and finding it difficult to work with others at times but is also able to have other times at work where things flow in a healthier direction. Work has been more stressful  recently. Has had some success in improving very gradually in working on her self-criticalness and self-judging in harsh ways. 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 Feels this was a good step for her to volunteer although it is not easy for her. Low self esteem, and patient hoping some of her changes will help create some positive "over-all" change for her. Is handling work situations amongst coworkers better in some ways. Noticing some of her positives occasionally. Self-esteem work continues and needs more attention. Is also working to "get out of my comfort zone".  Body image issues. "I sometimes go all over the place when talking like this about my positives and also all my challenges at work. Continues work on her self-image and viewing herself in more healthy and positive ways.  Interventions: Cognitive Behavioral Therapy and Ego-Supportive  Reduce overall level, frequency, and intensity of the anxiety so that daily functioning is not impaired. Increase understanding of beliefs and messages that produce the worry and anxiety. Identify, challenge, and replace fearful self talk with positive, realistic, and empowering self talk.   Diagnosis:   ICD-10-CM   1. Generalized anxiety disorder  F41.1      Plan:  Patient today showing good motivation as she continues working on her  symptoms of low self-esteem, anxiety, depression, over analyzing, negativity, and various situational issues both inside and outside of work situations.  She has made some progress and viewing herself a little less negative and trying to understand how if she holds onto her negative thoughts this interrupts significantly her being able to make progress.  Patient is gaining insight on  this.  She also has a strong history of feeling negative towards herself, feeling powerlessness and having low self-esteem.  Patient is motivated and wanting to let go over the negative messages about herself and be able to see herself in a more positive and realistic light.  Sara Hull has made some initial progress thus far and does need to continue her work with goal-directed behaviors to move in a more hopeful and healthier direction into the future.  Goal review and progress/challenges noted with patient.  Next appointment within 2 weeks.   Mathis Fare, LCSW

## 2023-05-27 ENCOUNTER — Other Ambulatory Visit: Payer: Self-pay | Admitting: Internal Medicine

## 2023-05-27 DIAGNOSIS — Z1231 Encounter for screening mammogram for malignant neoplasm of breast: Secondary | ICD-10-CM

## 2023-06-04 ENCOUNTER — Ambulatory Visit: Payer: 59 | Admitting: Psychiatry

## 2023-06-04 ENCOUNTER — Other Ambulatory Visit: Payer: Self-pay | Admitting: Radiology

## 2023-06-04 DIAGNOSIS — F411 Generalized anxiety disorder: Secondary | ICD-10-CM

## 2023-06-04 DIAGNOSIS — E890 Postprocedural hypothyroidism: Secondary | ICD-10-CM

## 2023-06-04 NOTE — Progress Notes (Signed)
 [facility] Counselor/Therapist Progress Note  Patient ID: Sara Hull, MRN: [id number],    Date: 06/04/2023  Time Spent: 53 minutes   Treatment Type: Individual Therapy  Reported Symptoms:  anxiety, low self esteem improving some, depression, overanalyzing herself, tends to see "my negatives more than my positives"    Mental Status Exam:  Appearance:   Casual and Neat     Behavior:  Appropriate, Sharing, and Motivated  Motor:  Normal  Speech/Language:   Clear and Coherent  Affect:  Depressed, Flat, and anxious  Mood:  anxious and depressed  Thought process:  goal directed  Thought content:    WNL  Sensory/Perceptual disturbances:    WNL  Orientation:  oriented to person, place, time/date, situation, day of week, month of year, year, and stated date of June 04, 2023  Attention:  Fair  Concentration:  Good and Fair  Memory:  WNL  Fund of knowledge:   Good  Insight:    Good  Judgment:   Good  Impulse Control:  Good   Risk Assessment: Danger to Self:  No Self-injurious Behavior: No Danger to Others: No Duty to Warn:no Physical Aggression / Violence:No  Access to Firearms a concern: No  Gang Involvement:No   Subjective:    Patient actively involved in session today as we focused more on her depression, low self-esteem, anxiety, anger at world issues and with current leadership, frustrations, and continued concerns about "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  Patient did tend to focus more on her own particular issues rather than any particular political figure, which was helpful.  "Trying to look at more of what I can do to help myself versus what I cannot seem to do".  Also acknowledging almost as many positives as negatives that she sees about herself, and this is significant progress for her and she realizes she really needs to continue working on this to build some momentum as her progress has been quite recent and it is quite different  from the negativity she has felt about herself for quite a long time.  Less self judging.  Self-esteem is still a challenge.  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 This is stirring up more fear for patient and not know what can be trusted.  Some other work challenges and patient tending to sometimes be on the defensive quickly and finding it difficult to work with others whose views can be quite different.  She is however definitely putting forth effort and that is encouraging.  Still compares herself to others at times but to a lesser degree.  Working hard to be less self-critical and more interactive with others as we are working to help patient know that she does actually sit in with her work group and has contributed to it.  Really working to try and improve her self-esteem and also noticing some of her positives rather than just the negatives.  Some body image issues and is working on this as well, especially to be more realistic with herself.  Really working to try and view herself in more positive and healthier ways.  Husband is very supportive which is a big help for patient.   Interventions: Cognitive Behavioral Therapy and Ego-Supportive Reduce overall level, frequency, and intensity of the anxiety so that daily functioning is not impaired. Increase understanding of beliefs and messages that produce the worry and anxiety. Identify, challenge,  and replace fearful self talk with positive, realistic, and empowering self talk.   Diagnosis:   ICD-10-CM   1. Generalized anxiety disorder  F41.1      Plan:  Patient today in session as she works further on symptoms of low self-esteem, anxiety, overanalyzing, negativity, depression, and some situational issues inside and outside of work environments.  Was able to note and share some progress particularly in her last negative view of herself which she continues  to work on, as it has been a problem for some time and she is seeing how difficult it is sometimes to pull away and move in a more positive direction with her thoughts however is motivated and is working on this.  Also realizing her history includes a lot of negativity towards herself and lower sense of self-esteem.  Patient is continuing to work on both of these issues as well and showing good motivation, along with being more positive and realistic with herself.  Sherryn Pollino is continuing to make progress and needs to continue her work with goal-directed behaviors to keep moving in a more hopeful and healthier direction into the future.   Goal review and progress/challenges noted with patient.  Next appointment within 2 weeks.   Kelleen Patee, LCSW

## 2023-06-04 NOTE — Telephone Encounter (Signed)
 Med refill request: Synthroid  200 mcg Last AEX: 02/21/23 Next AEX: none scheduled Last MMG (if hormonal med) n/a Last TSH 12/21/21 LVM (ok per DPR)  verifying RF's for Synthroid  are filled through our office or PCP.  Last RF 05/03/23 by JC.  ? Needs labs. Refill authorized: Please Advise?

## 2023-06-06 ENCOUNTER — Ambulatory Visit
Admission: RE | Admit: 2023-06-06 | Discharge: 2023-06-06 | Disposition: A | Discharge status: Home / Self Care | Source: Ambulatory Visit | Attending: Internal Medicine | Admitting: Internal Medicine

## 2023-06-06 DIAGNOSIS — Z1231 Encounter for screening mammogram for malignant neoplasm of breast: Secondary | ICD-10-CM

## 2023-06-26 ENCOUNTER — Ambulatory Visit: Admitting: Psychiatry

## 2023-06-26 DIAGNOSIS — F411 Generalized anxiety disorder: Secondary | ICD-10-CM | POA: Diagnosis not present

## 2023-06-26 NOTE — Progress Notes (Signed)
 [facility] Counselor/Therapist Progress Note  Patient ID: Sara Hull, MRN: [id number],    Date: 06/26/2023  Time Spent: 53 minutes   Treatment Type: Individual Therapy  Reported Symptoms: anxiety decreased some, low self esteem continues to improve,depression improving, overanalyzing herself is decreasing, seeing my "negatives more than my positives but am a bit easier on myself"    Mental Status Exam:  Appearance:   Casual and Neat     Behavior:  Appropriate, Sharing, and Motivated  Motor:  Normal  Speech/Language:   Clear and Coherent  Affect:  Anxious, some depression  Mood:  anxious  Thought process:  goal directed  Thought content:    WNL  Sensory/Perceptual disturbances:    WNL  Orientation:  oriented to person, place, time/date, situation, day of week, month of year, year, and stated date of Jun 26, 2023  Attention:  Good  Concentration:  Good  Memory:  WNL  Fund of knowledge:   Good  Insight:    Good  Judgment:   Good  Impulse Control:  Good   Risk Assessment: Danger to Self:  No Self-injurious Behavior: No Danger to Others: No Duty to Warn:no Physical Aggression / Violence:No  Access to Firearms a concern: No  Gang Involvement:No   Subjective:    Patient showing good participation and motivation in session today as she continued work on her depression, anxiety, anger at world issues with current leadership, low self-esteem, frustrations, and concerned about "how disturbing the world is" currently. Job security- choosing to not dwell on  this and focusing more on "what actually is versus what may or may not be". Getting out around more people is helping, talking here and really working on her therapy goals,  and the improved weather she feels may be helping. Also talked to a supervisor at work and felt some better although she acknowledges "nothing is certain." Also learned that it is not likely to be an imminent issue or threat to their jobs, per reliable  source. Working to hold on to positives and not assuming negatives. Reassurance from her boss has helped some also. Some decrease in her fear and anxiety. *Realizing that some of her emotional pain and stress has been "self-inflicted" and she was able to talk further in detail today, working through her thoughts and feelings and trying not to continue "letting go". Husband remains supportive which is a positive for patient. Self-judging decreasing some. Self-esteem still challenging but is a work in progress. Trying not to be "on the defensive as much".  Reports putting forth more effort in not comparing self as much to others and interact more with others. Relationship with husband continues to be healthy. Encouraged to continue work on her self esteem and to develop more of a habit of looking for her strengths. To continue working on more positive body image issues, particularly being more realistic and developing habits centered around healthier and more positive views of herself. (Emotional agility-next session-reminder).  Interventions: Cognitive Behavioral Therapy and Ego-Supportive Reduce overall level, frequency, and intensity of the anxiety so that daily functioning is not impaired. Increase understanding of beliefs and messages that produce the worry and anxiety. Identify, challenge, and replace fearful self talk with positive, realistic, and empowering self talk.   Diagnosis:   ICD-10-CM   1. Generalized anxiety disorder  F41.1      Plan:   Patient putting forth good effort in session today working further on her anxiety, overanalyzing, lower self-esteem, negativity, depression,  and some situational issues both inside and outside of work environment. Noting specific areas of progress and growth, as noted above. Patient continues to work hard in sessions and trying to show more motivation along being more realistic with herself and her expectations of herself.  Sara Hull is making progress  and needs to continue working with goal-directed behaviors to keep moving in a more hopeful and healthier direction into her future.  Goal review and progress/challenges noted with patient.  Next appointment within 2 weeks.   Kelleen Patee, LCSW

## 2023-07-09 ENCOUNTER — Ambulatory Visit: Admitting: Psychiatry

## 2023-07-09 DIAGNOSIS — F411 Generalized anxiety disorder: Secondary | ICD-10-CM | POA: Diagnosis not present

## 2023-07-09 NOTE — Progress Notes (Signed)
 [facility] Counselor/Therapist Progress Note  Patient ID: Sara Hull, MRN: [id number],    Date: 07/09/2023  Time Spent: 55 minutes   Treatment Type: Individual Therapy  Reported Symptoms:  anxiety, low self esteem, over-analyzing herself, mild depression, seeing my "negatives more than my positives but am improving some   Mental Status Exam:  Appearance:   Casual and Neat     Behavior:  Appropriate, Sharing, and Motivated  Motor:  Normal  Speech/Language:   Clear and Coherent  Affect:  Depressed and anxious  Mood:  anxious and depressed  Thought process:  goal directed  Thought content:    WNL  Sensory/Perceptual disturbances:    WNL  Orientation:  oriented to person, place, time/date, situation, day of week, month of year, year, and stated date of Jul 09, 2023  Attention:  Good  Concentration:  Good  Memory:  WNL  Fund of knowledge:   Good  Insight:    Good  Judgment:   Good  Impulse Control:  Good   Risk Assessment: Danger to Self:  No Self-injurious Behavior: No Danger to Others: No Duty to Warn:no Physical Aggression / Violence:No  Access to Firearms a concern: No  Gang Involvement:No   Subjective:   Patient in session today motivated and working further on her anxiety, depression, low self-esteem, overanalyzing, negative self talk, frustrations, 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 Sara Hull has helped her feel some better. Is noticing some progress but mood can be up and down at times depending on how the day has gone, but not sharp changes in mood. Second-guesses herself often especially at work, and sometimes feels like an outsider at work. Sometimes "my identity is mostly related to my job" and often works extra from home during non-work hours. Feels often she doesn't have  much going on and doubts self, finds self-love difficult, regrets from past, all accumulate and leads to grieving certain areas of her life. Did not have "healthy parents" (mom was "mentally ill" and "had bipolar"). Dad worked all the time. At age 62 for patient, she was helping mom with her mental health issues but mom did get some better but hard to hold a job or had med issues on multiple occasions. Parent's marital relationship not very strong. Parents not very nuturing. Needing more self-acceptance as discussed in session and talked about how we are able to have this. Needing "hobbies" and areas of interest---"never learned these things growing up and mom's alcoholism got in the way."  Self judging the kidney itself to work on this going forward.  Also realizing how some of her emotional pain and stress has been self-inflicted and agrees to work on this also.  Husband is her most supportive person which is a positive for her.  Long history of family difficulties continues to haunt her now and has trouble letting go.  We plan to work further on this and also help patient work on being more accepting of "the positives", allowing herself to feel more accepted by others, and began to interrupt some of her self-inflicted painful thoughts and work towards letting go.  Wanting at some point to feel connected to a church and talked about this in more detail today as she feels that environment might be a more understanding environment.  To work more on  stopping the comparison of herself to others and working on her own self-esteem trying to develop the habit of seeing her strengths rather than just what she perceives as her weaknesses.  Has shown some improvement in body image issues.  (Explore emotional agility more next session).  Interventions: Cognitive Behavioral Therapy and Ego-Supportive Reduce overall level, frequency, and intensity of the anxiety so that daily functioning is not impaired. Increase  understanding of beliefs and messages that produce the worry and anxiety. Identify, challenge, and replace fearful self talk with positive, realistic, and empowering self talk.    Diagnosis:   ICD-10-CM   1. Generalized anxiety disorder  F41.1      Plan:   Patient actively involved in session today as she worked more on her over analyzing, anxiety, lower self-esteem, negativity, depression, and some situational issues and an outside of work environment.  Showing good effort and motivation in sessions and continues to work hard in addressing issues more directly, being more realistic with herself and having healthy expectations of herself and others.  Sara Hull continues to make progress in therapy and is committed to continue her work with goal-directed behaviors helping her to keep moving forward in a more hopeful and healthier direction.  Goal review and progress/challenges noted with patient.  Next appointment within 2 weeks.   Sara Patee, LCSW

## 2023-07-25 ENCOUNTER — Ambulatory Visit: Admitting: Psychiatry

## 2023-07-25 DIAGNOSIS — F411 Generalized anxiety disorder: Secondary | ICD-10-CM

## 2023-07-25 NOTE — Progress Notes (Signed)
 [facility] Counselor/Therapist Progress Note  Patient ID: Sara Hull, MRN: [id number],    Date: 07/25/2023  Time Spent: 60 minutes   Treatment Type: Individual Therapy  Reported Symptoms:  anxiety, low self esteem, over-analyzing herself, depression, work-related issues    Mental Status Exam:  Appearance:   Casual and Well Groomed     Behavior:  Appropriate, Sharing, and Motivated  Motor:  Normal  Speech/Language:   Clear and Coherent  Affect:  Depressed and anxious  Mood:  anxious and depressed  Thought process:  goal directed  Thought content:    Rumination  Sensory/Perceptual disturbances:    WNL  Orientation:  oriented to person, place, time/date, situation, day of week, month of year, year, and stated date of July 25, 2023  Attention:  Good  Concentration:  Good  Memory:  WNL  Fund of knowledge:   Good  Insight:    Good and Fair  Judgment:   Good  Impulse Control:  Good   Risk Assessment: Danger to Self:  No Self-injurious Behavior: No Danger to Others: No Duty to Warn:no Physical Aggression / Violence:No  Access to Firearms a concern: No  Gang Involvement:No   Subjective:   Patient today showing active participation and motivation (although struggling more) working further on her anxiety, low self-esteem, depression, negative self talk, overanalyzing, frustration, 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 Work situation has been very problematic since last appt. Aunt with whom she is close is having some brain related concerns which added to patient's distress. Work situation happening more recently and patient feeling hurt and not important and I'm just extra.  Some misunderstanding between patient and supervisor and needed session today to vent and process a lot that had gone on with work-related situations that  escalated and involved patient  and supervisor. Patient became very upset and needed our session today to vent further and talk through her feelings in more detail.  Did seem to feel heard and understood, and willing to try to work on being more understanding of herself and not assuming the negatives in situations that can be tense and uncertain especially on her job.  Also working to not wake most of her identity hinge on her job, and often works extra hours from home during non-- working hours.  Self-doubt is struggling at times and self love is difficult.  Regrets from the past that we plan to go up on in upcoming session.  Realizes how these issues tend to accumulate and leads to patient's unresolved grief from certain areas of her life in the past and present.  Her home growing up was not led by healthy parents as mother was mentally ill and had bipolar and dad was often not present in the home due to working all the time.  Parents were not very nurturing.  Self judging increased.  Good realization of how some of her emotional pain and stress has definitely been self-inflicted and patient is committed to working further on this.  Her husband is emotionally healthy and her biggest support.  Lots of emotional pain and it can easily be triggered even by small things.  Self judging continues.  Will plan to work on these issues further in sessions and help patient to become more accepting and realizing of her positives, and also allow herself to feel more  accepted by others and began to interrupt self-inflicted painful thoughts and work towards healing and letting go.  Mentions again today about wanting at some point to feel connected within her church and plans to talk more with her husband about this.  Need to see more strengths versus weaknesses in herself.  She has made some improvement in body image issues.  Plan to further discuss emotional agility with patient.    Interventions: Cognitive Behavioral  Therapy, Solution-Oriented/Positive Psychology, and Ego-Supportive Reduce overall level, frequency, and intensity of the anxiety so that daily functioning is not impaired. Increase understanding of beliefs and messages that produce the worry and anxiety. Identify, challenge, and replace fearful self talk with positive, realistic, and empowering self talk.  Diagnosis:   ICD-10-CM   1. Generalized anxiety disorder  F41.1      Plan:   Patient today participating actively in session as she focused further on her anxiety, overanalyzing, self-esteem issues, negativity, depression, and some situational issues inside and outside of work environment.  Worked hard in session and it was difficult at times for patient but seems to be gaining some insight into some of her deeper issues especially revolving around her own sense of self-esteem and lack of self love and self-care.  Did show good motivation in session and worked hard even though the issues she was addressing were quite difficult.  Needing to be more realistic with herself which includes having healthy expectations. Sara Hull is making progress in therapy and remains very committed to continuing her work with goal-directed behaviors helping her to be motivated to keep moving forward in a more hopeful and healthier direction into the future.  Goal review and progress/challenges noted with patient.  Next appointment within 2 weeks.   Kelleen Patee, LCSW

## 2023-08-06 ENCOUNTER — Ambulatory Visit: Admitting: Psychiatry

## 2023-08-06 DIAGNOSIS — F411 Generalized anxiety disorder: Secondary | ICD-10-CM

## 2023-08-06 NOTE — Progress Notes (Signed)
 [facility] Counselor/Therapist Progress Note  Patient ID: Sara Hull, MRN: [id number],    Date: 08/06/2023  Time Spent: 60 minutes   Treatment Type: Individual Therapy  Reported Symptoms:  anxiety, low self esteem, over-analyzing herself, depression, work-related issued   Mental Status Exam:  Appearance:   Casual and Neat     Behavior:  Appropriate, Sharing, and Motivated  Motor:  Normal  Speech/Language:   Clear and Coherent  Affect:  Depressed and anxious  Mood:  anxious and depressed  Thought process:  goal directed  Thought content:    Rumination  Sensory/Perceptual disturbances:    WNL  Orientation:  oriented to person, place, time/date, situation, day of week, month of year, year, and stated date of August 06, 2023  Attention:  Good  Concentration:  Good  Memory:  WNL  Fund of knowledge:   Good  Insight:    Good  Judgment:   Good  Impulse Control:  Good   Risk Assessment: Danger to Self:  No Self-injurious Behavior: No Danger to Others: No Duty to Warn:no Physical Aggression / Violence:No  Access to Firearms a concern: No  Gang Involvement:No   Subjective:  Patient in session today participating well working more on her depression, anxiety, low self-esteem, negative self-talk, overanalyzing, frustration, 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 Personally struggling more with work issues and my emotions are all over the place. Not quite as emotionally upset today but still very bothered by her stressful work environment/situations/relationships.  Hurt and sad by some work related situations that continue to happen at her job, feeling judged, and having difficulty in a few of her relationships at work with peers. Problem-solved her relationship issue and particularly her assumption of negative without checking anything out to see if her assumptions are valid. Used examples with this in session today which seemed helpful. Some increased  awareness of her tendency to self-negate repeatedly.  Interventions: Cognitive Behavioral Therapy, Solution-Oriented/Positive Psychology, and Ego-Supportive Reduce overall level, frequency, and intensity of the anxiety so that daily functioning is not impaired. Increase understanding of beliefs and messages that produce the worry and anxiety. Identify, challenge, and replace fearful self talk with positive, realistic, and empowering self talk.   Diagnosis:   ICD-10-CM   1. Generalized anxiety disorder  F41.1      Plan:   Today patient participating actively in session working on her anxiety, overanalyzing, self-esteem issues, assuming negatives, depression, and other situational issues in work environment and outside of that environment. 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 Frustrated as she feels she is trying to do her best of her job. Trust issues with other co-workers. Putting forth good efforts in working on her goals and having a tough time in work environment. Encouraged her to follow through more on suggestions offered by boss, not be judging herself harshly and trying to help her work towards letting go of the self-negating. Difficult to hear ways she might could feel better without taking it critical. Trying to help her strengthen her self-esteem, self-love, and self-care. Difficult to look at options to better manage her self-doubt, and hard to practice letting go of ill feelings towards herself and others particularly co-workers. Sara Hull is making progress in therapy and continues her work with goal-directed behaviors helping her to be motivated to keep moving forward in a more hopeful  and healthier direction into the future.  Goal review and progress/challenges noted with patient.  Next appointment within 2 weeks.   Barnie Bunde,  LCSW

## 2023-08-06 NOTE — Progress Notes (Deleted)
      [facility] Counselor/Therapist Progress Note  Patient ID: Sara Hull, MRN: [id number],    Date: 08/06/2023  Time Spent: ***   Treatment Type: {CHL AMB THERAPY TYPES:[id number]}  Reported Symptoms: ***  Mental Status Exam:  Appearance:   {PSY:22683}     Behavior:  {PSY:21022743}  Motor:  {PSY:22302}  Speech/Language:   {PSY:22685}  Affect:  {PSY:22687}  Mood:  {PSY:31886}  Thought process:  {PSY:31888}  Thought content:    {PSY:937-146-3220}  Sensory/Perceptual disturbances:    {PSY:603-322-8260}  Orientation:  {PSY:30297}  Attention:  {PSY:22877}  Concentration:  {PSY:304-706-9327}  Memory:  {PSY:[id number]}  Fund of knowledge:   {PSY:304-706-9327}  Insight:    {PSY:304-706-9327}  Judgment:   {PSY:304-706-9327}  Impulse Control:  {PSY:304-706-9327}   Risk Assessment: Danger to Self:  {PSY:22692} Self-injurious Behavior: {PSY:22692} Danger to Others: {PSY:22692} Duty to Warn:{PSY:311194} Physical Aggression / Violence:{PSY:21197} Access to Firearms a concern: {PSY:21197} Gang Involvement:{PSY:21197}  Subjective: ***   Interventions: {PSY:234-488-2515}  Diagnosis:No diagnosis found.  Plan: ***  Barnie Bunde, LCSW

## 2023-08-19 ENCOUNTER — Ambulatory Visit: Admitting: Psychiatry

## 2023-08-19 DIAGNOSIS — F411 Generalized anxiety disorder: Secondary | ICD-10-CM

## 2023-08-19 NOTE — Progress Notes (Signed)
 [facility] Counselor/Therapist Progress Note  Patient ID: DEB LOUDIN, MRN: [id number],    Date: 08/19/2023  Time Spent: 53 minutes   Treatment Type: Individual Therapy   Reported Symptoms: anxiety, low self esteem, over-analyzing herself, depression improving some, work-related issues    Mental Status Exam:  Appearance:   Neat     Behavior:  Appropriate, Sharing, and Motivated  Motor:  Normal  Speech/Language:   Clear and Coherent  Affect:  Anxious, some depression  Mood:  anxious and depressed  Thought process:  goal directed  Thought content:    Rumination  Sensory/Perceptual disturbances:    WNL  Orientation:  oriented to person, place, time/date, situation, day of week, month of year, year, and stated date of August 19, 2023  Attention:  Good  Concentration:  Good  Memory:  WNL  Fund of knowledge:   Good  Insight:    Good  Judgment:   Good  Impulse Control:  Good   Risk Assessment: Danger to Self:  No Self-injurious Behavior: No Danger to Others: No Duty to Warn:no Physical Aggression / Violence:No  Access to Firearms a concern: No  Gang Involvement:No   Subjective: Patient today working further on her anxiety, depression, low self-esteem, negative self-talk, overanalyzing, frustration, and anger at world issues and politics.  Is off from work this week to help with husband after his knee-replacement surgery. Trying to not over-dwell on  work issues. Tending to label herself in negative ways and only see the negative which heightens her insecurity. Trust issues and worked on these more today with specfics. Encouraged her putting forth more effort in letting go of her negative thoughts and self-talk and put more of her energy and focus onto becoming more self accepting with less negative self-talk and less over-analyzing which the negative always tends to always go in a negative direction for patient. Lots of stress and this impacts patient in her work  and beyond. Currently added stress at work with particular situation with patient and trying to make best decisions.Manipulated recently by client parent and needing to process how to best handle these type of situations better so she does not become so emotionally involved and reactive, as she plans to consult with supervisory staff at her job.  Realizing she is a little more emotionally stressed today due to some personal issues and also her husband's upcoming surgery tomorrow.  We discussed her continued need to not jump to negative conclusions about situations and also to refrain from self negating.  Interventions: Cognitive Behavioral Therapy, Solution-Oriented/Positive Psychology, and Ego-Supportive 1.Reduce overall level, frequency, and intensity of the anxiety so that daily functioning is not impaired. 2.Increase understanding of beliefs and messages that produce the worry and anxiety. 3.Identify, challenge, and replace fearful self talk with positive, realistic, and empowering self talk.    Diagnosis:   ICD-10-CM   1. Generalized anxiety disorder  F41.1      Plan: Patient in session today working further on her depression, low self-esteem, negative self talk, anxiety, overanalyzing, and frustration.  Working more on her tendency to assume the negatives and particularly a negative that relates to herself.  Directly confronted this today and patient did well and seem to have a different level of understanding on this issue, acknowledging that it does get in her way often and she needs to work more intentionally on interrupting these thoughts and feelings on the front and rather than assuming they are accurate and it get worse.  Used specific example in working with patient today on this, along with some homework directly related to her self-esteem issues, over analyzing, and assuming the negatives particularly about herself and that feed her depression.  Patient continues to have struggles in  the work environment and with her own self-esteem and view of herself and the work environment. Encouraged patient to follow through more on the suggestions that were earlier offered by her boss last week that she had thought would be helpful, to not be judging herself so harshly and trying to help her work towards letting go of the self negating.  She reports trying to strengthen her self-esteem, self love, and self-care and this is a work in progress.  Has difficulty in looking at options to better manage her self-doubt and finds it hard to practice letting go of the negative feelings she has torture self and sometimes others particularly in her line of work.  Conley Pawling continues to make progress in therapy and works hard as she focuses on goal-directed behaviors helping her to be motivated to keep moving forward in a healthier and more hopeful direction.    Goal review and progress/challenges noted with patient.  Next appointment within 2 weeks.   Barnie Bunde, LCSW

## 2023-08-26 ENCOUNTER — Ambulatory Visit: Admitting: Psychiatry

## 2023-09-04 ENCOUNTER — Ambulatory Visit: Admitting: Psychiatry

## 2023-09-10 ENCOUNTER — Ambulatory Visit: Admitting: Psychiatry

## 2023-09-17 ENCOUNTER — Ambulatory Visit: Admitting: Psychiatry

## 2023-09-17 DIAGNOSIS — F411 Generalized anxiety disorder: Secondary | ICD-10-CM | POA: Diagnosis not present

## 2023-09-17 NOTE — Progress Notes (Signed)
 [facility] Counselor/Therapist Progress Note  Patient ID: Sara Hull, MRN: [id number],    Date: 09/17/2023  Time Spent: 50 minutes   Treatment Type: Individual Therapy  Reported Symptoms: anxiety, over-analyzing self some better, depression some better, work-related issues worse, low self-esteem    Mental Status Exam:  Appearance:   Casual     Behavior:  Appropriate, Sharing, and Motivated  Motor:  Normal  Speech/Language:   Clear and Coherent  Affect:  Depressed and anxious  Mood:  anxious and depressed  Thought process:  goal directed  Thought content:    Ruminating decreased  Sensory/Perceptual disturbances:    WNL  Orientation:  oriented to person, place, time/date, situation, day of week, month of year, year, and stated date of Aug. 5, 2025  Attention:  Good  Concentration:  Good  Memory:  WNL  Fund of knowledge:   Good  Insight:    Good  Judgment:   Good  Impulse Control:  Good   Risk Assessment: Danger to Self:  No Self-injurious Behavior: No Danger to Others: No Duty to Warn:no Physical Aggression / Violence:No  Access to Firearms a concern: No  Gang Involvement:No   Subjective:    Patient actively involved in working on her anxiety, depression, low self-esteem, negative self talk, frustration, overanalyzing, and also some anger at the world issues.  Really needing to process a lot that happened before and after husband's surgery. Some better in some ways. Husband had recent knee-replacement recently and it went ok but has been stressful after the surgery. Patient's work continues to be stressful and anxiety-producing. Labels herself in negative ways and a tendency to see things as negative and this had fed patient's insecurity. Continued work on some trust issues. Also needing to vent and process issues with husband's recent knee-replacement surgery which had been a nightmare for patient. Things have improved some since that time and felt being  able to vent and share her frustrations and stressors in session today, focusing on her own coping and mental health, and being able to let go some of the past in order to move forward gradually. Focusing of some more effective self-care realizing more of her need for this. Stress after husband's surgery is improving gradually. To work further on her negative thoughts and self-talk, becoming more self-accepting, less over-analyzing.  Discussed her need to not jump ahead and assume negative conclusions about situations, which has been a problem from her past and she is definitely motivated to work further on this and also wanting to stop her negative self negating.   Interventions: Cognitive Behavioral Therapy, Solution-Oriented/Positive Psychology, and Ego-Supportive 1.Reduce overall level, frequency, and intensity of the anxiety so that daily functioning is not impaired. 2.Increase understanding of beliefs and messages that produce the worry and anxiety. 3.Identify, challenge, and replace fearful self talk with positive, realistic, and empowering self talk.   Diagnosis:   ICD-10-CM   1. Generalized anxiety disorder  F41.1      Plan:    Patient in today working more in session on her depression, low self-esteem, negative self talk, overanalyzing, frustrations, and anxiety.  Struggling to interrupt her tendency to always assume the negatives and most often a negative that relates to herself in someway.  Husband is finally doing a little better after his recent knee replacement surgery.  Hopefully their stress at home will continue to decrease a little as he gradually improves.  Encouraging patient to also work further on some trust issues  and self-esteem issues which also tends to feed her depression and anxiety, and will pick up further on this at next session.  Patient has been out of work having to help her husband after his surgery but will be going back soon.  Is concerned because she has struggled  in the work environment with her own self-esteem as well as some relationship issues with coworkers.  Encouraging patient more today to follow through on some suggestions discussed in session, in addition to working hard to let go of her own self negating.  Encouraged the strengthening of her self-esteem, self love, and self-care which is a working progress for patient currently.  Trying to better manage self-doubt and admits that it is hard to let go of negative feelings she has towards herself and sometimes others especially in the workplace.  Sharea Guinther has made progress in therapy and continues to work hard as she focuses on goal-directed behaviors that are helping her to be more motivated and keep moving forward in a healthier and more hopeful direction going forward.  Goal review and progress/challenges noted with patient.  Next appointment within 2 weeks.   Barnie Bunde, LCSW

## 2023-09-17 NOTE — Progress Notes (Deleted)
 [facility] Counselor/Therapist Progress Note  Patient ID: Sara Hull, MRN: [id number],    Date: 09/17/2023  Time Spent: ***   Treatment Type: {[facility] AMB THERAPY TYPES:[id number]}  Reported Symptoms: ***   anxiety, low self esteem, over-analyzing herself, depression improving some, work-related issues       Mental Status Exam:  Appearance:   {PSY:22683}     Behavior:  {PSY:21022743}  Motor:  {PSY:22302}  Speech/Language:   {PSY:22685}  Affect:  {PSY:22687}  Mood:  {PSY:31886}  Thought process:  {PSY:31888}  Thought content:    {PSY:925-038-1239}  Sensory/Perceptual disturbances:    {PSY:([phone] 250-9486}  Orientation:  {PSY:30297}  Attention:  {PSY:22877}  Concentration:  {PSY:(860)204-7491}  Memory:  {PSY:([phone]}  Fund of knowledge:   {PSY:([phone]}  Insight:    {PSY:(860)204-7491}  Judgment:   {PSY:([phone]}  Impulse Control:  {PSY:([phone])204-7491}   Risk Assessment: Danger to Self:  {PSY:22692} Self-injurious Behavior: {PSY:22692} Danger to Others: {PSY:22692} Duty to Warn:{PSY:311194} Physical Aggression / Violence:{PSY:21197} Access to Firearms a concern: {PSY:21197} Gang Involvement:{PSY:21197}  Subjective: ***     Patient today working further on her anxiety, depression, low self-esteem, negative self-talk, overanalyzing, frustration, and anger at world issues and politics. Is off from work this week to help with husband after his knee-replacement surgery. Trying to not over-dwell on work issues. Tending to label herself in negative ways and only see the negative which heightens her insecurity. Trust issues and worked on these more today with specfics. Encouraged her putting forth more effort in letting go of her negative thoughts and self-talk and put more of her energy and focus onto becoming more self accepting with less negative self-talk and less over-analyzing which the negative always tends to always go in a negative direction for patient. Lots of  stress and this impacts patient in her work and beyond. Currently added stress at work with particular situation with patient and trying to make best decisions.Manipulated recently by client parent and needing to process how to best handle these type of situations better so she does not become so emotionally involved and reactive, as she plans to consult with supervisory staff at her job. Realizing she is a little more emotionally stressed today due to some personal issues and also her husband's upcoming surgery tomorrow. We discussed her continued need to not jump to negative conclusions about situations and also to refrain from self negating.    Interventions: {PSY:365-527-8859} 1.Reduce overall level, frequency, and intensity of the anxiety so that daily functioning is not impaired. 2.Increase understanding of beliefs and messages that produce the worry and anxiety. 3.Identify, challenge, and replace fearful self talk with positive, realistic, and empowering self talk.   Diagnosis:No diagnosis found.      Plan: ***      Patient in session today working further on her depression, low self-esteem, negative self talk, anxiety, overanalyzing, and frustration.  Working more on her tendency to assume the negatives and particularly a negative that relates to herself.  Directly confronted this today and patient did well and seem to have a different level of understanding on this issue, acknowledging that it does get in her way often and she needs to work more intentionally on interrupting these thoughts and feelings on the front and rather than assuming they are accurate and it get worse.  Used specific example in working with patient today on this, along with some homework directly related to her self-esteem issues, over analyzing, and assuming the negatives particularly about herself and that feed her  depression.    Patient continues to have struggles in the work environment and with her own  self-esteem and view of herself and the work environment. Encouraged patient to follow through more on the suggestions that were earlier offered by her boss last week that she had thought would be helpful, to not be judging herself so harshly and trying to help her work towards letting go of the self negating.  She reports trying to strengthen her self-esteem, self love, and self-care and this is a work in progress.  Has difficulty in looking at options to better manage her self-doubt and finds it hard to practice letting go of the negative feelings she has torture self and sometimes others particularly in her line of work.  Sara Hull continues to make progress in therapy and works hard as she focuses on goal-directed behaviors helping her to be motivated to keep moving forward in a healthier and more hopeful direction.     ////////////////////////////////////////////////////////////////////////////////////////////////////////////////////////////////    Review and progress/challenges noted with patient.  Next appointment within 2 to 3 weeks.   Barnie Bunde, LCSW

## 2023-10-08 ENCOUNTER — Ambulatory Visit: Admitting: Psychiatry

## 2023-10-08 DIAGNOSIS — F411 Generalized anxiety disorder: Secondary | ICD-10-CM

## 2023-10-08 NOTE — Progress Notes (Signed)
 [facility] Counselor/Therapist Progress Note  Patient ID: Sara Hull, MRN: [id number],    Date: 10/08/2023  Time Spent: 55 minutes   Treatment Type: Individual Therapy  Reported Symptoms:  anxiety, over-analyzing self some better, depression some better, work-related issues calmed down some, low self-esteem    Mental Status Exam:  Appearance:   Casual and Neat     Behavior:  Appropriate, Sharing, and Motivated  Motor:  Normal  Speech/Language:   Clear and Coherent  Affect:  Depressed and anxious  Mood:  anxious and depressed  Thought process:  goal directed  Thought content:    WNL  Sensory/Perceptual disturbances:    WNL  Orientation:  oriented to person, place, time/date, situation, day of week, month of year, year, and stated date of Aug. 26, 2025  Attention:  Good  Concentration:  Good  Memory:  WNL  Fund of knowledge:   Good  Insight:    Good  Judgment:   Good  Impulse Control:  Good   Risk Assessment: Danger to Self:  No Self-injurious Behavior: No Danger to Others: No Duty to Warn:no Physical Aggression / Violence:No  Access to Firearms a concern: No  Gang Involvement:No   Subjective:     Patient working in session today on her depression, low self-esteem, negative self talk, anxiety, frustration, overanalyzing, and some as she states anger at the world issues. Reports some calming in negative thoughts. States work has been calmer, although continues to be a challenge for patient.  Continues to be some better in some ways. Husband recovering from recent knee-replacement surgery. Some social awkwardness there and doesn't know a lot of people there. Some trust issues with some people at work. Wanting to work on improving relationships at work and feel more a part of things and closer to people at work and beyond. Discussed strategies for doing this and patient seemed very connected and interested in following through, even if it's awkward initially. Work  is stressful more as school starts and patient wants to be better at managing her anxiety, which we talked further about before end of session, discussing specifics in session today. Really needing to work on her own self-esteem and less judging of herself, and refraining from negative self-talk and holding herself back. Worked with some insecurity issues in session today which seemed helpful and needs to work more on this.  For end of session, patient practiced some strategies that she could use particularly in the workplace or at church or other events involving multiple people in order to feel a part of the group for and being able to engage people in ways that she feels comfortable.  Encouraging that she is starting to realize more her need for healthier friendships and is gradually willing to try some new behaviors/strategies regarding this.  Trying to not jump ahead and make negative assumptions or conclusions about certain situations which has been a problem in her past and she is motivated to stop repeating those behaviors in the present and going forward especially because it involves a lot of self negating and she is starting to realize how destructive that can be. Interventions: Cognitive Behavioral Therapy, Solution-Oriented/Positive Psychology, and Ego-Supportive 1.Reduce overall level, frequency, and intensity of the anxiety so that daily functioning is not impaired. 2.Increase understanding of beliefs and messages that produce the worry and anxiety. 3.Identify, challenge, and replace fearful self talk with positive, realistic, and empowering self talk.   Diagnosis:   ICD-10-CM   1.  Generalized anxiety disorder  F41.1      Plan: Patient today focusing in session on her low self-esteem, depression, negative self talk, overanalyzing, frustrations, and anxiety.  She continues to work on trying to interrupt her tendency to assume the negatives and in most cases the negatives tend to relate in  someway to patient herself.  She has had to take some time more recently to be of assistance to her husband who had recent knee replacement surgery and is recovering.  Worked really well in session today taking a more active role in looking more at behaviors she needs to let go of as well as the behaviors that she needs to practice, especially ones that can lead to healthier relationships.  He is to continue her work on some previous trust issues that also relates to self-esteem, anxiety, and depression. Encouraged patient as we work on the strengthening of her self-esteem, self-love, and self-care which continues to be a working progress for patient.  Wants to better manage self-doubt, acknowledging that it is difficult to let go of negative feelings that she has towards herself and sometimes others, especially on her job.  Sara Hull has made some progress in therapy and continues to work and focus on her goal-directed behaviors that helps some and her motivation and also in her desire to keep moving forward in a healthier and more hopeful direction.  Goal review and progress/challenges noted with patient.  Next appointment within 2-3 weeks.   Barnie Bunde, LCSW

## 2023-10-22 ENCOUNTER — Ambulatory Visit: Admitting: Psychiatry

## 2023-10-22 DIAGNOSIS — F411 Generalized anxiety disorder: Secondary | ICD-10-CM | POA: Diagnosis not present

## 2023-10-22 NOTE — Progress Notes (Signed)
 [facility] Counselor/Therapist Progress Note  Patient ID: Sara Hull, MRN: [id number],    Date: 10/22/2023  Time Spent: 55 minutes   Treatment Type: Individual Therapy  Reported Symptoms: anxiety, over-analyzing, depression, work-related issues, low self-esteem improving some    Mental Status Exam:  Appearance:   Casual     Behavior:  Appropriate, Sharing, and Motivated  Motor:  Normal  Speech/Language:   Clear and Coherent  Affect:  Depressed and anxious  Mood:  anxious and depressed  Thought process:  goal directed  Thought content:    WNL  Sensory/Perceptual disturbances:    WNL  Orientation:  oriented to person, place, time/date, situation, day of week, month of year, year, and stated date of Sept. 9, 2025  Attention:  Good  Concentration:  Good  Memory:  WNL  Fund of knowledge:   Good  Insight:    Good  Judgment:   Good  Impulse Control:  Good   Risk Assessment: Danger to Self:  No Self-injurious Behavior: No Danger to Others: No Duty to Warn:no Physical Aggression / Violence:No  Access to Firearms a concern: No  Gang Involvement:No   Subjective:   Patient working further today on her low self-esteem, negative self-talk, frustration, anxiety, overanalyzing, depression, and some anger at the world issues. Working further on shutting down the flow of negative thoughts more. Can still get down on herself and trying to interrupt those unhealthy thoughts. Separating out some negative thoughts and negative self-talk and realizing they're not based in reality. Processed this further in session which was helpful. Concerned about a health issue that she is having checked out further in near future. Reports work has been calmer in some ways although still challenging. Decrease in negative thoughts. Husband recovering from knee replacement surgery and showing good progress. Has gotten together with friends a couple times recently. Work-related stress and trust issues.  Sometimes feeling more connected to some others at work. Some improvement in self-esteem and decrease in self-judging. Showing some shifts in a positive direction as she talks further about some changes she is working on that are goal-directed and patient is sensing some progress. She and Vickye are wanting to check out some churches soon as they feel that would be helpful for both of them. Some decrease in negative assumptions, is trying to interrupt negative thinking. Working on not looking for the negatives and feeding the healthier friendships she does have.    Interventions: Cognitive Behavioral Therapy, Solution-Oriented/Positive Psychology, and Insight-Oriented 1.Reduce overall level, frequency, and intensity of the anxiety so that daily functioning is not impaired. 2.Increase understanding of beliefs and messages that produce the worry and anxiety. 3.Identify, challenge, and replace fearful self talk with positive, realistic, and empowering self talk.    Diagnosis:   ICD-10-CM   1. Generalized anxiety disorder  F41.1      Plan: Really well in session today showing more progress as she focused further on her anxiety, negative self-talk, some depression, low self-esteem, overanalyzing, and frustrations.  Better able to do some letting go of things that she needs to let go of that put her down.  Showing progress and interrupting anxious thoughts and being able to replace with more realistic and positive thoughts.  Feeling better about herself and some ways which has been a very gradual process but she is starting to realize that this is happening some, which is significant improvement and certainly something she wants to build upon going forward.  [place]look is better  and working to have some self-love that can help her especially in her relationship within the family and with others.  Continue to encourage patient as she works to strengthen her self-care, self-love, her relationship with others,  and believing in herself more.  Trying to manage her self-doubt and more effective ways and not let it feed the self negating which she has struggled with.  Amit Meloy has definitely made progress in her therapy and continues to work/focus on her goal-directed behaviors that is helping her in multiple ways including her desire to keep moving forward in a healthier and more hopeful direction.  Goal review and progress/challenges noted with patient.  Next appointment within 2 to 3 weeks.   Barnie Bunde, LCSW

## 2023-11-05 ENCOUNTER — Ambulatory Visit: Admitting: Psychiatry

## 2023-11-25 ENCOUNTER — Ambulatory Visit: Admitting: Psychiatry

## 2023-11-25 DIAGNOSIS — F411 Generalized anxiety disorder: Secondary | ICD-10-CM | POA: Diagnosis not present

## 2023-11-25 NOTE — Progress Notes (Signed)
 [facility] Counselor/Therapist Progress Note  Patient ID: Sara Hull, MRN: [id number],    Date: 11/25/2023  Time Spent: 55 minutes   Treatment Type: Individual Therapy  Reported Symptoms: anxiety, depression, over-analyzing improving, work-related issues improving some, low self-esteem improving some    Mental Status Exam:  Appearance:   Casual     Behavior:  Appropriate, Sharing, and Motivated  Motor:  Normal  Speech/Language:   Clear and Coherent  Affect:  Depressed and anxious  Mood:  anxious and depressed  Thought process:  goal directed  Thought content:    Ruminating improved  Sensory/Perceptual disturbances:    WNL  Orientation:  oriented to person, place, time/date, situation, day of week, month of year, year, and stated date of Oct. 13, 2025  Attention:  Good  Concentration:  Good  Memory:  WNL  Fund of knowledge:   Good  Insight:    Good  Judgment:   Good  Impulse Control:  Good   Risk Assessment: Danger to Self:  No Self-injurious Behavior: No Danger to Others: No Duty to Warn:no Physical Aggression / Violence:No  Access to Firearms a concern: No  Gang Involvement:No   Subjective:   Patient today in session and continuing to work on her anxiety, depression, negative self-talk, overanalyzing, low self-esteem, and some anger at the world issues. Reporting some success recently with personal and work-related issues and behaviors. Not as may negative thoughts about herself. Hearing positive comments on herself or her work and accepting those comments and feeling more positive about herself, but it is a work in progress. Got some unexpected compliments at work has felt good and did not minimize them. Not quite as negative in her self-judgement and is working on this and trying not to dwell on them. Trying to eat healthier. Walking a little more. In few months I'll be 58 yrs old and that is bothering me and I am concerned about my health and am  paying more attention. Doesn't get quite as down on herself. Having some health concerns and trying to stay in the present versus make assumptions. Trying hard not to dwell on all the concerns but also trying to coach herself to remain calm and hopeful. Catching herself in her panic about upcoming presentations at work, and trying to coach herself in a healthier and more calming directions. Some decrease in negative thoughts and negative self-talk. I feel like I'm getting in a better head-space, and not quite as down on myself and I am still working on this. Work remains challenging although calmer. Has spent some time with friends recently and want to continue this. Continues working on trust issues and work-related stress which tends to be up and down. Trying to have healthier connections at work and decrease her self-judging. She and husband Vickye are pulling together better and their marriage is strong.Trying not to assume the negatives and see more positives.   Interventions: Cognitive Behavioral Therapy, Solution-Oriented/Positive Psychology, and Ego-Supportive 1.Reduce overall level, frequency, and intensity of the anxiety so that daily functioning is not impaired. 2.Increase understanding of beliefs and messages that produce the worry and anxiety. 3.Identify, challenge, and replace fearful self talk with positive, realistic, and empowering self talk.    Diagnosis:   ICD-10-CM   1. Generalized anxiety disorder  F41.1      Plan:   Patient today actively working and showing more progress particularly in her anxiety, depression, self-esteem, frustrations, overanalyzing, and some negative self-talk.  Showed more commitment  and poise in session today as she worked through difficult issues primarily centered around her job and relationships.  Working to let go things that she is needing to let go of and recognizing this more.  Able to interrupt some anxious thoughts and trying to replace them  with healthier and more realistic thoughts.  Is making some progress and feeling better about herself in some ways and other ways is still negative.  Overall outlook is improving.  Self-love, improved self-care are definite goals for her.  Trying to manage self-doubt more effectively and be able to believe in herself more fully.  Bettyanne Dittman is making progress in her therapy and continues to work/focus on her goal-directed behaviors so as to move forward in a healthier and more hopeful direction.  Goal review and progress/challenges noted with patient.  Next appointment within 2 to 3 weeks.   Barnie Bunde, LCSW

## 2023-12-10 ENCOUNTER — Ambulatory Visit: Admitting: Psychiatry

## 2023-12-10 DIAGNOSIS — F411 Generalized anxiety disorder: Secondary | ICD-10-CM

## 2023-12-10 NOTE — Progress Notes (Signed)
 [facility] Counselor/Therapist Progress Note  Patient ID: Sara Hull, MRN: [id number],    Date: 12/10/2023  Time Spent: 53 minutes   Treatment Type: Individual Therapy  Reported Symptoms: anxiety, depression some better, over-analyzing, work-related issues improving some, low self-esteem and struggling more with this currently    Mental Status Exam:  Appearance:   Neat     Behavior:  Appropriate, Sharing, and Motivated  Motor:  Normal  Speech/Language:   Clear and Coherent  Affect:  Depressed and anxious  Mood:  anxious and depressed  Thought process:  goal directed  Thought content:    Rumination  Sensory/Perceptual disturbances:    WNL  Orientation:  oriented to person, place, time/date, situation, day of week, month of year, year, and stated date of Oct. 28, 2025  Attention:  Good  Concentration:  Good  Memory:  WNL  Fund of knowledge:   Good  Insight:    Good  Judgment:   Good  Impulse Control:  Good   Risk Assessment: Danger to Self:  No Self-injurious Behavior: No Danger to Others: No Duty to Warn:no Physical Aggression / Violence:No  Access to Firearms a concern: No  Gang Involvement:No   Subjective: Patient today working further in session on her anxiety, depression, negative self-talk, overanalyzing, low self-esteem, and frustrated with world issues.  States she needs to talk and vent today about her dislike of herself and her anxiety.  Anxiety is more prominent right now and I worry about everything and I think I have some seasonal affective disorder. And my 55 birthday is a few months from now. States I've not been handling aging very well.  Feel like I've been working my whole life on my appearance and I'm noticing that I'm not liking myself again because of my weight. Feels some of it is menopausal. Down on herself and is self-degrading finding all of what she feels are her negatives. To see a hemotologist Dec. 8th based on some results of  recent lab work. Also started some pantaprazole due to acid reflux. Does feel tired and achy a lot and Dr having some testing done to be soon and trying not to assume it's anything bad.  Stated several times earlier in session that she just needed to vent today due to several things going on with her emotionally and physically. Relationship with brother is not a mutually satisfying relationship as it's mostly just me being present for him.  Still some negative thoughts re: herself. Personal and work-related issues/behaviors that she is continuing to work on. Some increased negative thoughts/feelings about herself very obvious today. Stating again her need to vent which was encouraged/supported by therapist. Occasionally gets compliments at work and that feels confirming for patient. Self-judgment is rigid. Will be 59 in a few months and mentioned this several times today as she vented. Very impatient with myself. Trying to ear healthier and is walking a little more. Wants to be in shape before 59th birthday. Don't love my body.  Lots of self-defeating messages to herself that we tried to interrupt and sort through in session today. Did feel heard which she says she always does however she does seem to stay more focused today and is hopeful that she can hold onto a little more belief in herself and making some positive changes, but also understanding change will take some time but getting started as her main point right now.  Needing to decrease her negative self-talk and seemed more committed to  this by end of session.  Wanting to have better connections and relationships at work and herself judging does get in the way.  Her husband Sara Hull is supportive.  Wanting to be in a better headspace but feels more recently she slept back as she got overwhelmed with negatives and other concerns.  Needs more time with friends.  Also needing to work further on work-related stress issues and trust issues.  Encouraged  her continuing to work on decreasing self judging and improving her ability to connect in healthy relationships.  Stress at work continues to be up-and-down.  Is needing to talk further with husband about them having more time together as she does feel their marriage is strong but does need more time and enjoyment in activities together.  Interventions: Cognitive Behavioral Therapy, Solution-Oriented/Positive Psychology, and Ego-Supportive 1.Reduce overall level, frequency, and intensity of the anxiety so that daily functioning is not impaired. 2.Increase understanding of beliefs and messages that produce the worry and anxiety. 3.Identify, challenge, and replace fearful self talk with positive, realistic, and empowering self talk.   Diagnosis:   ICD-10-CM   1. Generalized anxiety disorder  F41.1      Plan: Patient today worked actively in session and focused on her anxiety, depression, lowered self-esteem, frustrations, overanalyzing, and negative self-talk.  Understanding how each of these negatively impact her mood and her mindset and discussed this in more detail.  Will pick up again and further sessions.  Continues to need to focus more on letting go of things that are negative and tend to over occupy her mind.  Trying to interrupt anxious thoughts and replace them with healthier and more realistic thoughts is also a goal.  Her outlook has not been as good more recently.  Definite goals for her include improved self-love, self-care, and looking more for the positives versus negatives.  Sara Hull commits in session today to try and focus more on the positives and see some of her progress previously in therapy and continue to work now on her goal-directed behaviors so as to move forward in a healthier and more hopeful direction.  Goal review and progress/challenges noted with patient.  Next appointment within 3 weeks.   Barnie Bunde, LCSW

## 2023-12-25 ENCOUNTER — Ambulatory Visit: Admitting: Psychiatry

## 2024-01-08 ENCOUNTER — Ambulatory Visit: Admitting: Psychiatry

## 2024-01-13 ENCOUNTER — Ambulatory Visit: Admitting: Psychiatry

## 2024-02-03 ENCOUNTER — Ambulatory Visit: Admitting: Psychiatry

## 2024-02-26 ENCOUNTER — Encounter: Payer: Self-pay | Admitting: Obstetrics and Gynecology

## 2024-02-26 ENCOUNTER — Ambulatory Visit (INDEPENDENT_AMBULATORY_CARE_PROVIDER_SITE_OTHER): Admitting: Obstetrics and Gynecology

## 2024-02-26 VITALS — BP 122/78 | HR 80 | Temp 97.6°F | Ht 69.75 in | Wt 192.0 lb

## 2024-02-26 DIAGNOSIS — N958 Other specified menopausal and perimenopausal disorders: Secondary | ICD-10-CM | POA: Diagnosis not present

## 2024-02-26 DIAGNOSIS — F3341 Major depressive disorder, recurrent, in partial remission: Secondary | ICD-10-CM | POA: Diagnosis not present

## 2024-02-26 DIAGNOSIS — Z1331 Encounter for screening for depression: Secondary | ICD-10-CM

## 2024-02-26 DIAGNOSIS — Z01419 Encounter for gynecological examination (general) (routine) without abnormal findings: Secondary | ICD-10-CM | POA: Diagnosis not present

## 2024-02-26 MED ORDER — ESTRADIOL 0.01 % VA CREA: TOPICAL_CREAM | VAGINAL | 1 refills | Status: AC

## 2024-02-26 NOTE — Assessment & Plan Note (Signed)
 Discussed use of HRT given worsening mood symptoms, poor sleep, increased feelings of stress and weight gain. However, given age, I would recommend that she start estroven and lifestyle modifications, including stress reduction techniques like mindfulness, proper nutrition, regular strength training, improve sleep. Could use HRT until age 60yo under extended use, however discussed risk a/w this.  Cervical cancer screening performed according to ASCCP guidelines. Encouraged annual mammogram screening Colonoscopy UTD DXA UTD Labs and immunizations with her primary Encouraged safe sexual practices as indicated Encouraged healthy lifestyle practices with diet and exercise For patients under 50-70yo, I recommend 1200mg  calcium daily and 600IU of vitamin D  daily.

## 2024-02-26 NOTE — Assessment & Plan Note (Signed)
 Recommend start vaginal estrace  given symptoms Reviewed safety profile of low dose vaginal estrogen, however reviewed that higher doses have been associated with DVT, breast and uterine cancer.

## 2024-02-26 NOTE — Patient Instructions (Signed)
 For patients under 50-60yo, I recommend 1200mg  calcium  daily and 600IU of vitamin D daily. For patients over 60yo, I recommend 1200mg  calcium  daily and 800IU of vitamin D daily.  Health Maintenance, Female Adopting a healthy lifestyle and getting preventive care are important in promoting health and wellness. Ask your health care provider about: The right schedule for you to have regular tests and exams. Things you can do on your own to prevent diseases and keep yourself healthy. What should I know about diet, weight, and exercise? Eat a healthy diet  Eat a diet that includes plenty of vegetables, fruits, low-fat dairy products, and lean protein. Do not eat a lot of foods that are high in solid fats, added sugars, or sodium. Maintain a healthy weight Body mass index (BMI) is used to identify weight problems. It estimates body fat based on height and weight. Your health care provider can help determine your BMI and help you achieve or maintain a healthy weight. Get regular exercise Get regular exercise. This is one of the most important things you can do for your health. Most adults should: Exercise for at least 150 minutes each week. The exercise should increase your heart rate and make you sweat (moderate-intensity exercise). Do strengthening exercises at least twice a week. This is in addition to the moderate-intensity exercise. Spend less time sitting. Even light physical activity can be beneficial. Watch cholesterol and blood lipids Have your blood tested for lipids and cholesterol at 60 years of age, then have this test every 5 years. Have your cholesterol levels checked more often if: Your lipid or cholesterol levels are high. You are older than 60 years of age. You are at high risk for heart disease. What should I know about cancer screening? Depending on your health history and family history, you may need to have cancer screening at various ages. This may include screening  for: Breast cancer. Cervical cancer. Colorectal cancer. Skin cancer. Lung cancer. What should I know about heart disease, diabetes, and high blood pressure? Blood pressure and heart disease High blood pressure causes heart disease and increases the risk of stroke. This is more likely to develop in people who have high blood pressure readings or are overweight. Have your blood pressure checked: Every 3-5 years if you are 25-57 years of age. Every year if you are 24 years old or older. Diabetes Have regular diabetes screenings. This checks your fasting blood sugar level. Have the screening done: Once every three years after age 62 if you are at a normal weight and have a low risk for diabetes. More often and at a younger age if you are overweight or have a high risk for diabetes. What should I know about preventing infection? Hepatitis B If you have a higher risk for hepatitis B, you should be screened for this virus. Talk with your health care provider to find out if you are at risk for hepatitis B infection. Hepatitis C Testing is recommended for: Everyone born from [birth year] through 1966. Anyone with known risk factors for hepatitis C. Sexually transmitted infections (STIs) Get screened for STIs, including gonorrhea and chlamydia, if: You are sexually active and are younger than 60 years of age. You are older than 60 years of age and your health care provider tells you that you are at risk for this type of infection. Your sexual activity has changed since you were last screened, and you are at increased risk for chlamydia or gonorrhea. Ask your health care provider if  you are at risk. Ask your health care provider about whether you are at high risk for HIV. Your health care provider may recommend a prescription medicine to help prevent HIV infection. If you choose to take medicine to prevent HIV, you should first get tested for HIV. You should then be tested every 3 months for as long as you  are taking the medicine. Osteoporosis and menopause Osteoporosis is a disease in which the bones lose minerals and strength with aging. This can result in bone fractures. If you are 72 years old or older, or if you are at risk for osteoporosis and fractures, ask your health care provider if you should: Be screened for bone loss. Take a calcium  or vitamin D supplement to lower your risk of fractures. Be given hormone replacement therapy (HRT) to treat symptoms of menopause. Follow these instructions at home: Alcohol use Do not drink alcohol if: Your health care provider tells you not to drink. You are pregnant, may be pregnant, or are planning to become pregnant. If you drink alcohol: Limit how much you have to: 0-1 drink a day. Know how much alcohol is in your drink. In the [place] one drink equals one 12 oz bottle of beer (355 mL), one 5 oz glass of wine (148 mL), or one 1 oz glass of hard liquor (44 mL). Lifestyle Do not use any products that contain nicotine or tobacco. These products include cigarettes, chewing tobacco, and vaping devices, such as e-cigarettes. If you need help quitting, ask your health care provider. Do not use street drugs. Do not share needles. Ask your health care provider for help if you need support or information about quitting drugs. General instructions Schedule regular health, dental, and eye exams. Stay current with your vaccines. Tell your health care provider if: You often feel depressed. You have ever been abused or do not feel safe at home. Summary Adopting a healthy lifestyle and getting preventive care are important in promoting health and wellness. Follow your health care provider's instructions about healthy diet, exercising, and getting tested or screened for diseases. Follow your health care provider's instructions on monitoring your cholesterol and blood pressure. This information is not intended to replace advice given to you by your health  care provider. Make sure you discuss any questions you have with your health care provider. Document Revised: 06/20/2020 Document Reviewed: 06/20/2020 Elsevier Patient Education  2024 [employer].

## 2024-02-26 NOTE — Progress Notes (Signed)
 "  59 y.o. G0P0000 postmenopausal female with SUI, dysthymia here for annual exam. Married.  Pediatric speech pathologist. PCP: Deane Camie HERO., MD   No LMP recorded. Patient has had an ablation.  She reports white pimple inside of right labia, been there a while.  Vaginal itching and dryness.  Feeling stressed from work and having difficulty with the idea of aging. Feels she gaining weight, however she has stopped exercising and knows her diet could be improved. Not sleeping well due to work stress. Recently saw PCP to discuss zoloft  management.  Postmenopausal bleeding: none Pelvic discharge or pain: none Breast mass, nipple discharge or skin changes : none  Last PAP:     Component Value Date/Time   DIAGPAP  02/21/2023 1618    - Negative for intraepithelial lesion or malignancy (NILM)   DIAGPAP  10/29/2019 1603    - Negative for intraepithelial lesion or malignancy (NILM)   HPVHIGH Negative 02/21/2023 1618   HPVHIGH Negative 10/29/2019 1603   ADEQPAP  02/21/2023 1618    Satisfactory for evaluation; transformation zone component PRESENT.   ADEQPAP  10/29/2019 1603    Satisfactory for evaluation; transformation zone component PRESENT.   Last mammogram: 06/06/23 birads 1, density B  Last colonoscopy: 10/10/2015 Last DXA: 11/17/18, normal, done for incidental finding of compression fracture along spine  Sexually active: Yes, rarely Exercising: no 2/2 work stress Smoker: no  Garment/textile Technologist Visit from 02/26/2024 in [facility]  PHQ-2 Total Score 0   Flowsheet Row Office Visit from 11/05/2018 in [facility]  PHQ-9 Total Score 1   GYN HISTORY: No significant history  OB History  Gravida Para Term Preterm AB Living  0 0 0 0 0 0  SAB IAB Ectopic Multiple Live Births  0 0 0 0 0    Past Medical History:  Diagnosis Date   Allergic rhinitis    Dysthymia    Hypothyroidism    Rosacea     Past  Surgical History:  Procedure Laterality Date   HYSTEROSCOPY  04/2015   WITH ABLATION   HYSTEROSCOPY WITH D & C N/A 09/24/2014   Procedure: DILATATION AND CURETTAGE /HYSTEROSCOPY;  Surgeon: Jolene Gaskins, MD;  Location: [facility];  Service: Gynecology;  Laterality: N/A;   WISDOM TOOTH EXTRACTION      Current Outpatient Medications on File Prior to Visit  Medication Sig Dispense Refill   [employer] SUNSCREEN SPF 30 EX apply topically to face and body daily for 30     fluticasone  (FLONASE ) 50 MCG/ACT nasal spray 2 SPRAYS BOTH NOSTRILS DAILY 16 g 4   levothyroxine  (SYNTHROID ) 200 MCG tablet TAKE 1 TABLET BY MOUTH EVERYDAY BEFORE BREAKFAST 30 tablet 0   Loratadine 10 MG CAPS Claritin     sertraline  (ZOLOFT ) 100 MG tablet TAKE 1.5 TABLETS (150MG  TOTAL) BY MOUTH DAILY 135 tablet 3   No current facility-administered medications on file prior to visit.    Social History   Socioeconomic History   Marital status: Married    Spouse name: Not on file   Number of children: Not on file   Years of education: Not on file   Highest education level: Master's degree (e.g., MA, MS, MEng, MEd, MSW, MBA)  Occupational History   Occupation: SPEECH LANGUAGE PATHOLOGIST    Employer: [employer]  Tobacco Use   Smoking status: Never   Smokeless tobacco: Never  Vaping Use   Vaping status: Never Used  Substance and Sexual Activity   Alcohol use: Yes    Alcohol/week: 4.0 standard drinks of alcohol    Types: 4 Standard drinks or equivalent per week   Drug use: No   Sexual activity: Yes    Comment: Ablation  Other Topics Concern   Not on file  Social History Narrative   Married late in life. She nor husband had been married before and neither have kids.   Moved a lot as a kid, Dad was an Psychologist, Educational in [employer] and he moved places within [employer]. In 11th grade, they moved to [place] and that's where their home base is now. [place] has been in [place] for 25 years now. Mom did  different jobs, had a hard time keeping them d/t mental health.      No legal.   Religion-Christian. Grew up [employer].   Caffeine- 3 cups of coffee/d.  Sometimes in afternoon will have Starbucks coffee   Social Drivers of Health   Tobacco Use: Low Risk (02/26/2024)   Patient History    Smoking Tobacco Use: Never    Smokeless Tobacco Use: Never    Passive Exposure: Not on file  Financial Resource Strain: Not on file  Food Insecurity: Low Risk (06/12/2023)   Received from [facility]   Epic    Within the past 12 months, you worried that your food would run out before you got money to buy more: Never true    Within the past 12 months, the food you bought just didn't last and you didn't have money to get more. : Never true  Transportation Needs: No Transportation Needs (06/12/2023)   Received from [facility]    In the past 12 months, has lack of reliable transportation kept you from medical appointments, meetings, work or from getting things needed for daily living? : No  Physical Activity: Not on file  Stress: Not on file  Social Connections: Not on file  Intimate Partner Violence: Not on file  Depression (PHQ2-9): Low Risk (02/26/2024)   Depression (PHQ2-9)    PHQ-2 Score: 0  Alcohol Screen: Not on file  Housing: Low Risk (06/12/2023)   Received from [facility]   Epic    What is your living situation today?: I have a steady place to live    Think about the place you live. Do you have problems with any of the following? Choose all that apply:: None/None on this list  Utilities: Low Risk (06/12/2023)   Received from [facility]   Utilities    In the past 12 months has the electric, gas, oil, or water company threatened to shut off services in your home? : No  Health Literacy: Not on file    Family History  Problem Relation Age of Onset   Other Mother        nectrotizing of the colon-had ileostomy that has been reversed   Bipolar disorder Mother     Schizophrenia Mother    Dementia Mother    Heart disease Mother    Hyperlipidemia Father    Colon polyps Father    Cancer Father        bile duct cancer   Mental illness Brother        undefined, unable to live independently   Alcohol abuse Brother    Drug abuse Brother    Depression Maternal Grandmother    Cancer Paternal Grandfather    Colon cancer Neg Hx    Esophageal cancer Neg  Hx    Rectal cancer Neg Hx    Stomach cancer Neg Hx     No Known Allergies    PE Today's Vitals   02/26/24 0824  BP: 122/78  Pulse: 80  Temp: 97.6 F (36.4 C)  TempSrc: Oral  SpO2: 98%  Weight: 192 lb (87.1 kg)  Height: 5' 9.75 (1.772 m)   Body mass index is 27.75 kg/m.  Physical Exam Vitals reviewed. Exam conducted with a chaperone present.  Constitutional:      General: She is not in acute distress.    Appearance: Normal appearance.  HENT:     Head: Normocephalic and atraumatic.     Nose: Nose normal.  Eyes:     Extraocular Movements: Extraocular movements intact.     Conjunctiva/sclera: Conjunctivae normal.  Neck:     Thyroid : No thyroid  tenderness.  Pulmonary:     Effort: Pulmonary effort is normal.  Chest:     Chest wall: No mass or tenderness.  Breasts:    Right: Normal. No swelling, mass, nipple discharge, skin change or tenderness.     Left: Normal. No swelling, mass, nipple discharge, skin change or tenderness.  Abdominal:     General: There is no distension.     Palpations: Abdomen is soft.     Tenderness: There is no abdominal tenderness.  Genitourinary:    General: Normal vulva.     Exam position: Lithotomy position.     Urethra: No prolapse.     Vagina: Normal. No vaginal discharge or bleeding.     Cervix: Normal. No lesion.     Uterus: Normal. Not enlarged and not tender.      Adnexa: Right adnexa normal and left adnexa normal.     Comments: Inclusion cyst of right, upper vulva Musculoskeletal:        General: Normal range of motion.  Lymphadenopathy:      Upper Body:     Right upper body: No axillary adenopathy.     Left upper body: No axillary adenopathy.     Lower Body: No right inguinal adenopathy. No left inguinal adenopathy.  Skin:    General: Skin is warm and dry.  Neurological:     General: No focal deficit present.     Mental Status: She is alert.  Psychiatric:        Mood and Affect: Mood normal.        Behavior: Behavior normal.       Assessment and Plan:        Well woman exam with routine gynecological exam Assessment & Plan: Discussed use of HRT given worsening mood symptoms, poor sleep, increased feelings of stress and weight gain. However, given age, I would recommend that she start estroven and lifestyle modifications, including stress reduction techniques like mindfulness, proper nutrition, regular strength training, improve sleep. Could use HRT until age 60yo under extended use, however discussed risk a/w this.  Cervical cancer screening performed according to ASCCP guidelines. Encouraged annual mammogram screening Colonoscopy UTD DXA UTD Labs and immunizations with her primary Encouraged safe sexual practices as indicated Encouraged healthy lifestyle practices with diet and exercise For patients under 50-70yo, I recommend 1200mg  calcium daily and 600IU of vitamin D  daily.    Genitourinary syndrome of menopause Assessment & Plan: Recommend start vaginal estrace  given symptoms Reviewed safety profile of low dose vaginal estrogen, however reviewed that higher doses have been associated with DVT, breast and uterine cancer.    Orders: -     Estradiol ;  Apply 0.5g to vulva nightly for 2 weeks then 2 times a week. Do not use applicator.  Dispense: 42.5 g; Refill: 1  Recurrent major depressive disorder, in partial remission Assessment & Plan: Negative depression screen, however increased work-related stress noted. Continue management with PCP    Vera LULLA Pa, MD  "

## 2024-02-26 NOTE — Assessment & Plan Note (Addendum)
 Negative depression screen, however increased work-related stress noted. Continue management with PCP
